# Patient Record
Sex: Female | Born: 1975 | Race: White | Hispanic: No | Marital: Married | Smoking: Never smoker
Health system: Southern US, Community
[De-identification: ages and names within clinical notes are randomized; demographics above are authoritative.]

## PROBLEM LIST (undated history)

## (undated) DIAGNOSIS — C801 Malignant (primary) neoplasm, unspecified: Secondary | ICD-10-CM

## (undated) DIAGNOSIS — L309 Dermatitis, unspecified: Secondary | ICD-10-CM

## (undated) DIAGNOSIS — I493 Ventricular premature depolarization: Secondary | ICD-10-CM

## (undated) DIAGNOSIS — E039 Hypothyroidism, unspecified: Secondary | ICD-10-CM

## (undated) DIAGNOSIS — I341 Nonrheumatic mitral (valve) prolapse: Secondary | ICD-10-CM

## (undated) DIAGNOSIS — O039 Complete or unspecified spontaneous abortion without complication: Secondary | ICD-10-CM

## (undated) DIAGNOSIS — K219 Gastro-esophageal reflux disease without esophagitis: Secondary | ICD-10-CM

## (undated) DIAGNOSIS — K589 Irritable bowel syndrome without diarrhea: Secondary | ICD-10-CM

## (undated) DIAGNOSIS — Z9852 Vasectomy status: Secondary | ICD-10-CM

## (undated) HISTORY — PX: WISDOM TOOTH EXTRACTION: SHX21

## (undated) HISTORY — PX: COLONOSCOPY: SHX174

## (undated) HISTORY — PX: PELVIC LAPAROSCOPY: SHX162

## (undated) HISTORY — DX: Malignant (primary) neoplasm, unspecified: C80.1

## (undated) HISTORY — DX: Nonrheumatic mitral (valve) prolapse: I34.1

## (undated) HISTORY — DX: Ventricular premature depolarization: I49.3

## (undated) HISTORY — DX: Hypothyroidism, unspecified: E03.9

## (undated) HISTORY — DX: Gastro-esophageal reflux disease without esophagitis: K21.9

## (undated) HISTORY — DX: Dermatitis, unspecified: L30.9

## (undated) HISTORY — DX: Complete or unspecified spontaneous abortion without complication: O03.9

## (undated) HISTORY — DX: Vasectomy status: Z98.52

## (undated) HISTORY — DX: Irritable bowel syndrome, unspecified: K58.9

---

## 2003-07-25 ENCOUNTER — Other Ambulatory Visit: Admission: RE | Admit: 2003-07-25 | Discharge: 2003-07-25 | Payer: Self-pay | Admitting: Gynecology

## 2004-07-30 ENCOUNTER — Other Ambulatory Visit: Admission: RE | Admit: 2004-07-30 | Discharge: 2004-07-30 | Payer: Self-pay | Admitting: Gynecology

## 2005-01-28 DIAGNOSIS — C801 Malignant (primary) neoplasm, unspecified: Secondary | ICD-10-CM

## 2005-01-28 DIAGNOSIS — E039 Hypothyroidism, unspecified: Secondary | ICD-10-CM

## 2005-01-28 HISTORY — DX: Malignant (primary) neoplasm, unspecified: C80.1

## 2005-01-28 HISTORY — DX: Hypothyroidism, unspecified: E03.9

## 2005-05-28 HISTORY — PX: TOTAL THYROIDECTOMY: SHX2547

## 2005-11-22 ENCOUNTER — Inpatient Hospital Stay (HOSPITAL_COMMUNITY): Admission: AD | Admit: 2005-11-22 | Discharge: 2005-11-25 | Payer: Self-pay | Admitting: Gynecology

## 2005-12-03 ENCOUNTER — Inpatient Hospital Stay (HOSPITAL_COMMUNITY): Admission: AD | Admit: 2005-12-03 | Discharge: 2005-12-05 | Payer: Self-pay | Admitting: Gynecology

## 2005-12-09 ENCOUNTER — Inpatient Hospital Stay (HOSPITAL_COMMUNITY): Admission: AD | Admit: 2005-12-09 | Discharge: 2005-12-09 | Payer: Self-pay | Admitting: Obstetrics and Gynecology

## 2005-12-31 ENCOUNTER — Other Ambulatory Visit: Admission: RE | Admit: 2005-12-31 | Discharge: 2005-12-31 | Payer: Self-pay | Admitting: Gynecology

## 2007-01-02 ENCOUNTER — Other Ambulatory Visit: Admission: RE | Admit: 2007-01-02 | Discharge: 2007-01-02 | Payer: Self-pay | Admitting: Gynecology

## 2007-11-24 IMAGING — US US PELVIS COMPLETE
1 series · 14 of 24 positions shown · non-contrast
Comparison: none

CLINICAL DATA: Postop fever, C-section 11/22/05. 
 TRANSABDOMINAL PELVIC ULTRASOUND:
TECHNIQUE: Transabdominal ultrasound examination of the pelvis was performed including evaluation of the uterus, ovaries, adnexal regions, and pelvic cul-de-sac.

[Series 1: us pelvis complete · 0.43mm/px · 14 of 24 slices shown]
[im 1/24]
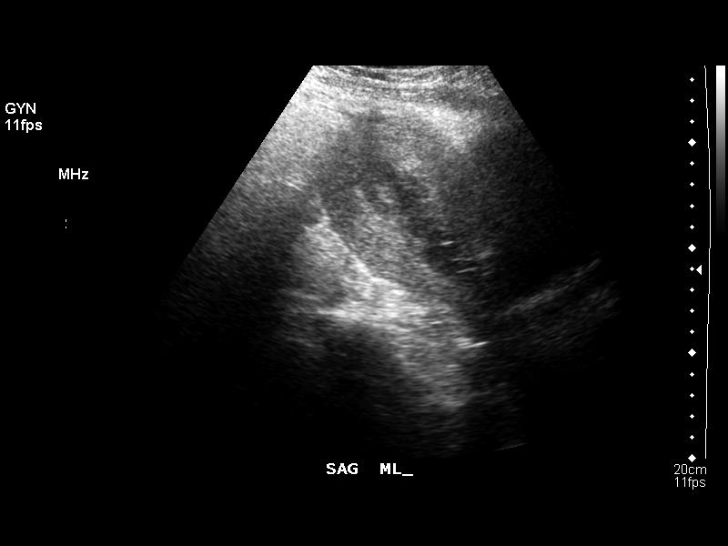
[im 3/24]
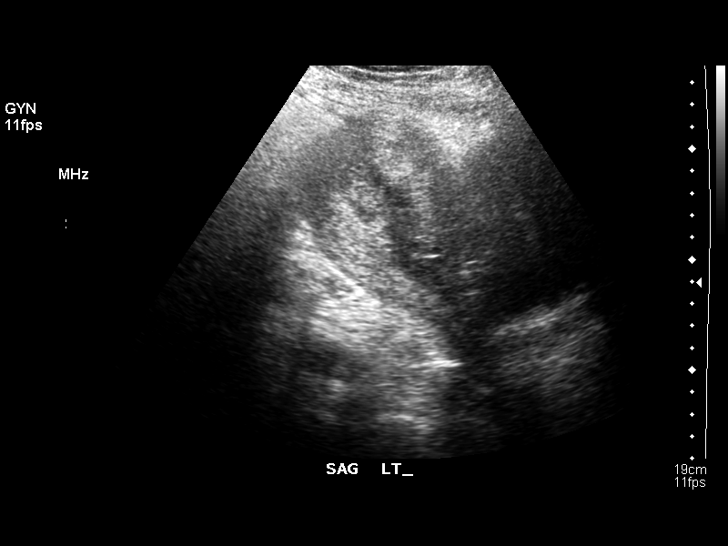
[im 5/24]
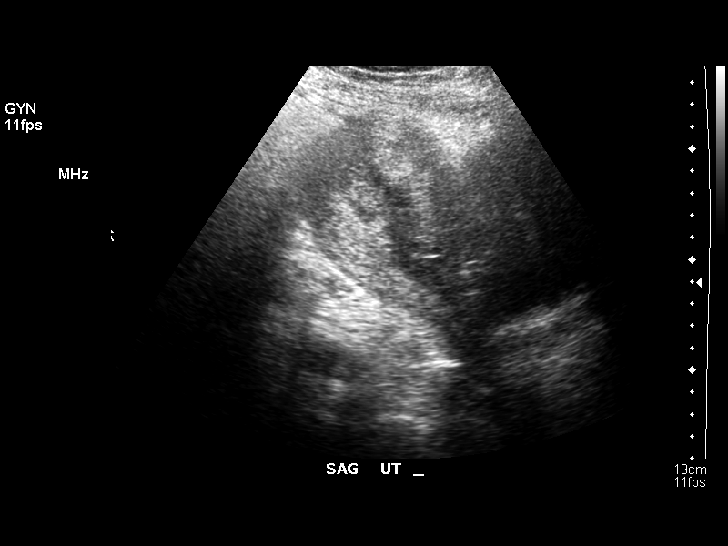
[im 7/24]
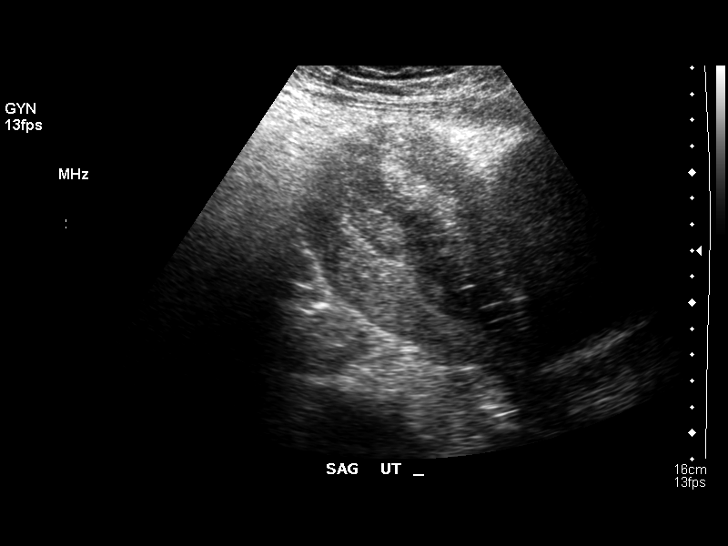
[im 8/24]
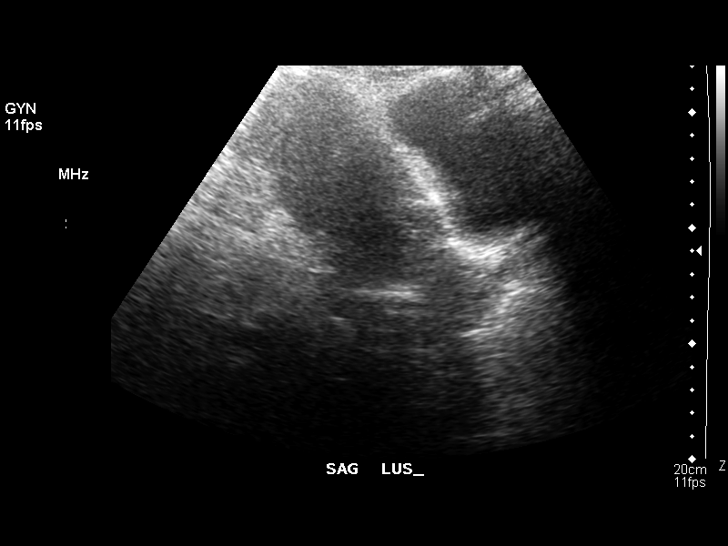
[im 10/24]
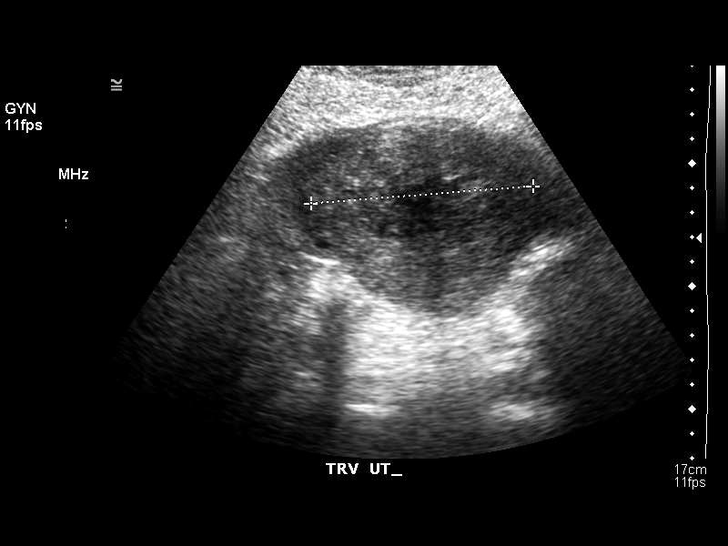
[im 12/24]
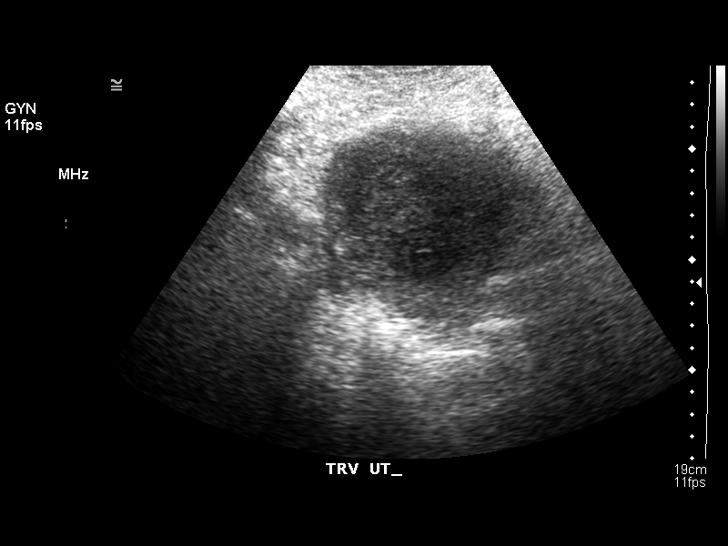
[im 13/24]
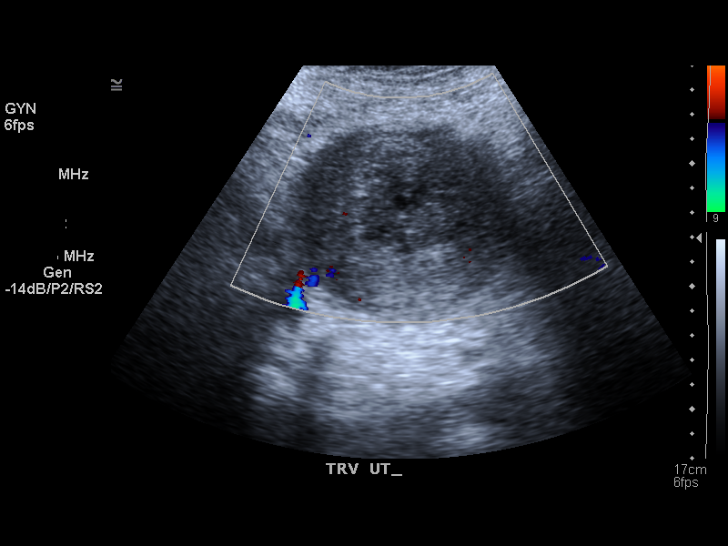
[im 15/24]
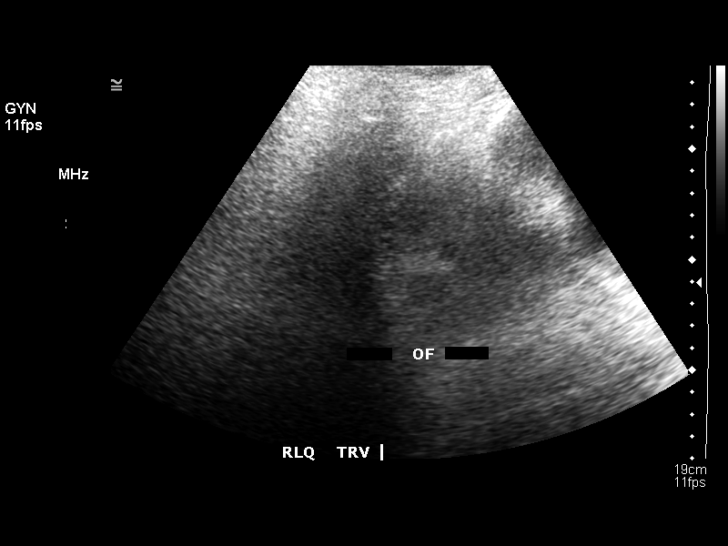
[im 17/24]
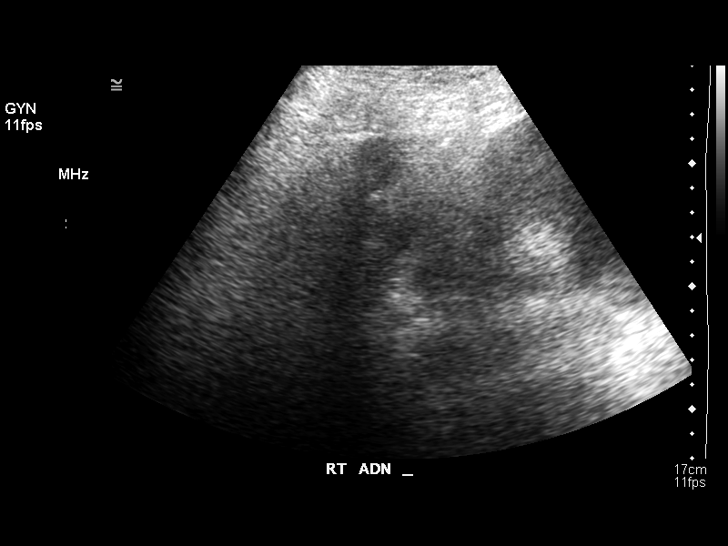
[im 19/24]
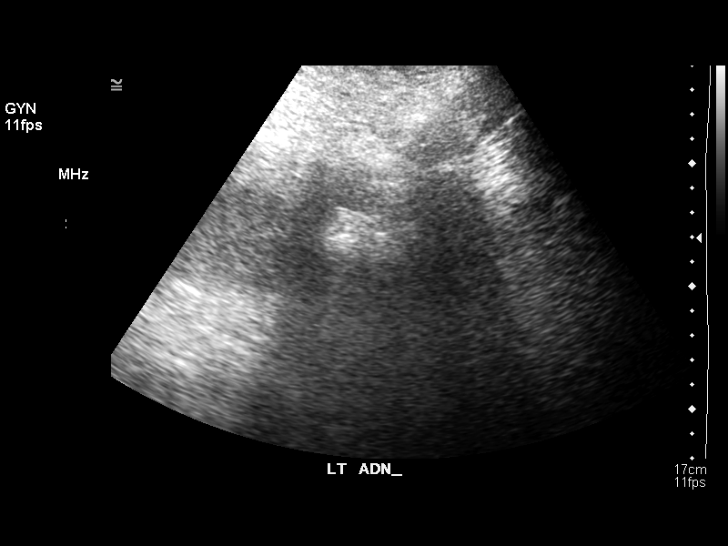
[im 20/24]
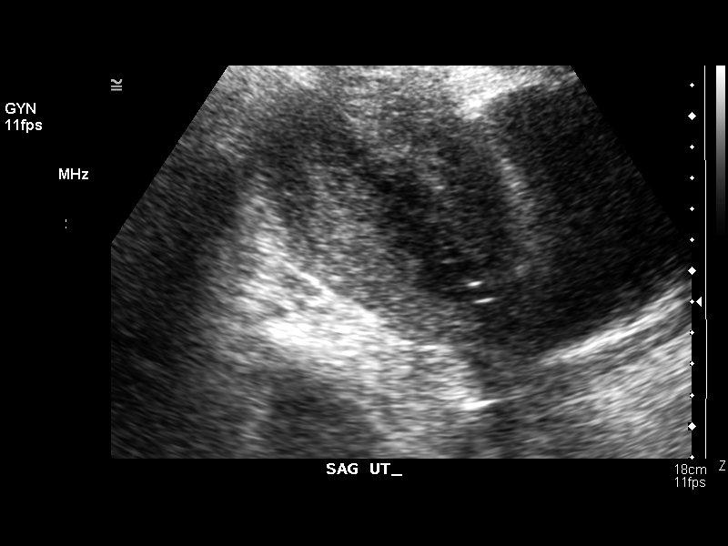
[im 22/24]
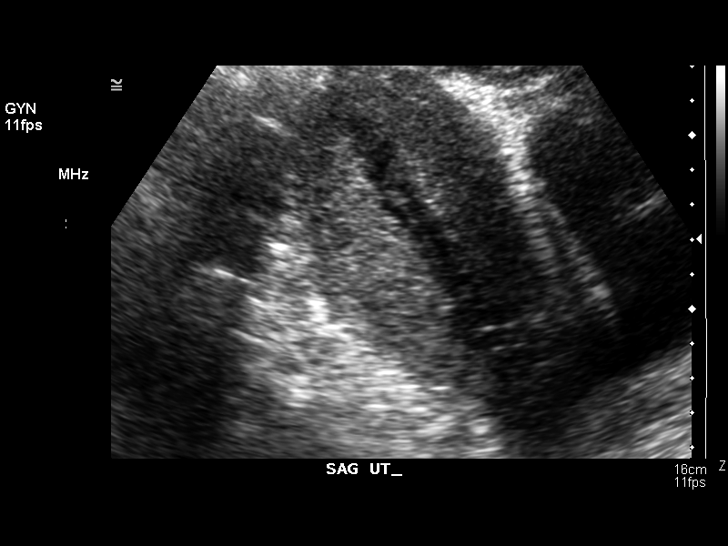
[im 24/24]
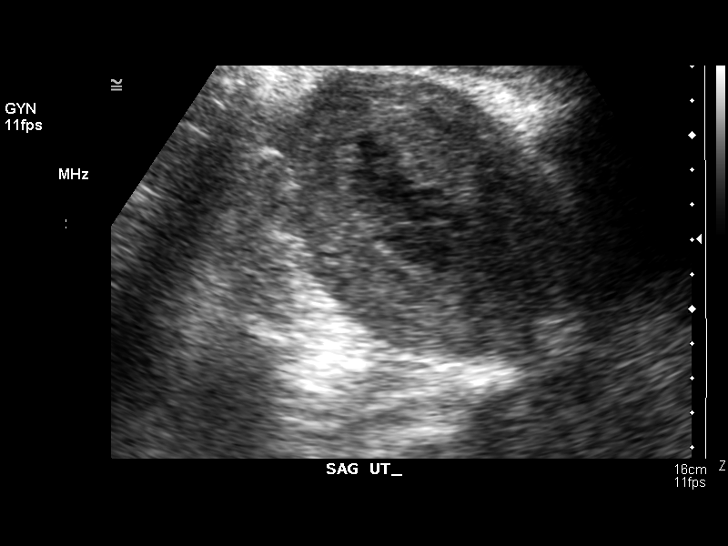

[14 of 24 positions shown; findings below may reference images not displayed]

FINDINGS: The uterus measures 11.3 cm sagittally with a depth of 6.9 cm and width of 9.1 cm.  There is low echogenicity within the endometrial cavity which is somewhat irregular.  Blood flow does not appear to be significantly increased, but the consideration would be that of residual blood, retained products, or possibly endometritis.  The endometrium measures 2.2 cm in diameter.  Neither ovary is visualized.  No fluid is seen within the pelvis.
IMPRESSION: Inhomogeneity within the endometrial cavity could represent blood, retained products, or possibly endometritis.  No abscess is seen and neither ovary is visible.

## 2008-01-13 ENCOUNTER — Encounter: Payer: Self-pay | Admitting: Gynecology

## 2008-01-13 ENCOUNTER — Ambulatory Visit: Payer: Self-pay | Admitting: Gynecology

## 2008-01-13 ENCOUNTER — Other Ambulatory Visit: Admission: RE | Admit: 2008-01-13 | Discharge: 2008-01-13 | Payer: Self-pay | Admitting: Gynecology

## 2009-05-24 ENCOUNTER — Inpatient Hospital Stay (HOSPITAL_COMMUNITY): Admission: RE | Admit: 2009-05-24 | Discharge: 2009-05-26 | Payer: Self-pay | Admitting: Obstetrics and Gynecology

## 2010-02-01 ENCOUNTER — Ambulatory Visit
Admission: RE | Admit: 2010-02-01 | Discharge: 2010-02-01 | Payer: Self-pay | Source: Home / Self Care | Attending: Gynecology | Admitting: Gynecology

## 2010-02-06 ENCOUNTER — Ambulatory Visit
Admission: RE | Admit: 2010-02-06 | Discharge: 2010-02-06 | Payer: Self-pay | Source: Home / Self Care | Attending: Gynecology | Admitting: Gynecology

## 2010-04-17 LAB — CBC
HCT: 24.7 % — ABNORMAL LOW (ref 36.0–46.0)
HCT: 35.1 % — ABNORMAL LOW (ref 36.0–46.0)
Hemoglobin: 12.3 g/dL (ref 12.0–15.0)
MCHC: 35.1 g/dL (ref 30.0–36.0)
MCV: 95.8 fL (ref 78.0–100.0)
RBC: 2.58 MIL/uL — ABNORMAL LOW (ref 3.87–5.11)
RDW: 13.1 % (ref 11.5–15.5)
WBC: 12.2 10*3/uL — ABNORMAL HIGH (ref 4.0–10.5)

## 2010-04-17 LAB — TYPE AND SCREEN: Antibody Screen: NEGATIVE

## 2010-06-15 NOTE — H&P (Signed)
[name], [name]             ACCOUNT NO.:  [id number]   MEDICAL RECORD NO.:  [id number]          PATIENT TYPE:  INP   LOCATION:  9169                          [facility]ACILITY:  [facility]   PHYSICIAN:  Timothy P. Fontaine, M.D.DATE OF BIRTH:  1976/01/21   DATE OF PROCEDURE:  DATE OF DISCHARGE:                      STAT - MUST CHANGE TO CORRECT WORK TYPE   CHIEF COMPLAINT:  Spontaneous rupture of membranes, contractions.   HISTORY OF PRESENT ILLNESS:  A 35 year old G3, P0, AB2 female at [redacted] weeks  gestation enters with gross rupture of membranes and irregular contractions.  The patient's prenatal course is complicated by total thyroidectomy May  2007. The patient is on thyroid replacement. Beta strep screen is negative.  Blood type is 0 negative. Rubella is positive. For the remainder of her  history, please see her [place].   PHYSICAL EXAMINATION:  HEENT:  Normal.  LUNGS:  Clear.  CARDIAC:  Regular rate. No rubs, murmurs or gallops.  ABDOMEN:  Gravid vertex fetus consistent with term. External monitors show  reactive fetal tracing with irregular contractions. Pelvic cervix fingertip  50% high vertex, positive rupture of membranes, clear fluid. IUPC placed.   ASSESSMENT AND PLAN:  A 35 year old G3, P0, AB 1 female with spontaneous  rupture of membranes clear, irregular contractions, cervix fingertip 50%  high. IUPC placed. Fetal tracing reactive. Will augment with Pitocin as  discussed with patient and husband who both understand and agree. Her beta  strep screen is negative. She is RH negative, rubella titer immune.      Timothy P. Fontaine, M.D.  Electronically Signed     TPF/MEDQ  D:  11/22/2005  T:  11/22/2005  Job:  [id number]

## 2010-06-15 NOTE — Op Note (Signed)
NAMEBERENIZE, GATLIN             ACCOUNT NO.:  [id number]   MEDICAL RECORD NO.:  [id number]          PATIENT TYPE:  INP   LOCATION:  9145                          [facility]ILITY[facility]   PHYSICIAN:  Juan H. Lily Peer, M.D.DATE OF BIRTH:  [birth year]   DATE OF PROCEDURE:  11/22/2005  DATE OF DISCHARGE:                                 OPERATIVE REPORT   INDICATIONS FOR OPERATION:  A 35 year old gravida 3, para 2 at 39-1/2 weeks  estimated gestational age is being taken to the operating room for a primary  cesarean section, secondary to prolonged rupture of membranes; and clinical  evidence of chorioamnionitis consisting of fetal tachycardia decreasing long-  term variability and maternal fever.  Also arrest of first stage of labor at  6 cm dilated.   PREOPERATIVE DIAGNOSES:  1. Term intrauterine pregnancy.  2. Prolonged rupture of membranes.  3. Chorioamnionitis.  4. Arrest of first stage of labor.   POSTOPERATIVE DIAGNOSES:  1. Term intrauterine pregnancy.  2. Prolonged rupture of membranes.  3. Chorioamnionitis.  4. Arrest of first stage of labor.   SURGEON:  Juan H. Lily Peer, M.D.   ANESTHESIA:  Epidural.   PROCEDURE PERFORMED:  Primary lower uterine segment transverse cesarean  section.   FINDINGS:  Viable female infant.  Apgars of 9 and 9 with a weight of 6  pounds 13 ounces.  Arterial cord pH of 7.36.  Clear amniotic fluid.  Normal  internal maternal pelvic anatomy.   DESCRIPTION OF OPERATION:  After the patient was adequately counseled she  was taken to the operating room where she was redosed through an through  epidural catheter.  Foley catheter was already in place.  Fetal heart rate  was in the 170s and scalp electrode was removed.  The abdomen was then  prepped and draped in the usual sterile fashion.  A Pfannenstiel skin  incision was made 2 cm above the symphysis pubis.  The incision was carried  out from the skin, subcutaneous tissue, and down to the rectus  fascia;  whereby a midline nick was made.  The fascia was incised in transverse  fashion.  The midline raphe was entered cautiously.  The bladder flap was  established; and the lower uterine segment was incised in a transverse  fashion.  Clear amniotic fluid was present.  The newborn's head was  delivered.  The nasopharyngeal area was bulb suctioned; and the cord was  doubly clamped and excised.  The newborn gave an immediate cry, was shown to  the parents; then passed off to the neonatologist who was in attendance who  gave the above-mentioned parameters.   After cord blood was obtained.  The placenta was delivered from the  intrauterine cavity and submitted for pathological evaluation.  The Pitocin  drip was initiated.  The patient had received 2.5 milliunits of Pen-G 2  hours prior and she will continue it postop for 24 hours.  The uterus was  exteriorized.  The intrauterine cavity was swept clear of the remaining  products of conception; and Pitocin drip had been started as a uterotonic  agent.  A transverse incision  was closed with a two-layer fashion.  The  first was a locking stitch of #0 Vicryl suture.  The second layer an  imbricating manner with #0 Vicryl suture as well.  The uterus was placed  back in the pelvic cavity.  Once again, each layer was copiously irrigated  with normal saline solution.  Sponge count and needle count were correct.  The transverse incision with evidence of good hemostasis.  The visceral  peritoneum was now reapproximated, but the rectus fascia was closed with  running stitch of #0 Vicryl suture.  The subcutaneous bleeders were Bovie  cauterized; and the skin was reapproximated with skin clips followed by  Xeroform gauze and 4 x 4 dressing.  The patient was transferred to recovery  room with stable vital signs.  Blood loss from procedure was 500 mL, IV  fluids 650 mL of lactated Ringer's.  Urine output 375 mL.      Juan H. Lily Peer, M.D.   Electronically Signed     JHF/MEDQ  D:  11/22/2005  T:  11/24/2005  Job:  [id number]

## 2010-06-15 NOTE — Discharge Summary (Signed)
[name], [name]             ACCOUNT NO.:  [id number]   MEDICAL RECORD NO.:  [id number]          PATIENT TYPE:  INP   LOCATION:  9145                          [facility]   PHYSICIAN:  Juan H. Lily Peer, M.D.DATE OF BIRTH:  05-23-1975   DATE OF ADMISSION:  11/22/2005  DATE OF DISCHARGE:  11/25/2005                                 DISCHARGE SUMMARY   TOTAL DAYS HOSPITALIZED:  Three.   HISTORY:  The patient is a 35 year old gravida 3, para 0, AB 2, now para 1  at 3 weeks' gestation when she was admitted to the hospital complaining of  spontaneous rupture of membranes.  The patient's prenatal course had been  significant for the fact that she had a total thyroidectomy and had been on  supplemental Synthroid.  The patient was augmented on Pitocin and had an  epidural but had prolonged rupture of membranes, developed chorioamnionitis  whereby her temperature was starting to increase, the highest was 100.8, and  she was taken for a primary cesarean secondary to arrest of first stage of  labor, chorioamnionitis, and prolonged rupture of membranes.  She delivered  a viable female infant, Apgars of 9 and 9, with a weight of 6 pounds 13  ounces.  Arterial cord pH was 7.36.  There was clear amniotic fluid.  Normal  maternal pelvic anatomy was reported.  She had a blood loss of 500 ml.  Postoperatively, the patient's hemoglobin and hematocrit were 9.1 and 25.1  respectively with a platelet count of 169,000.  Since her blood type was O  negative, the newborn was tested and was Rh positive and the mother received  RhoGAM on her second postoperative day.  The patient was restarted back on  her Synthroid where she was taking 125 mcg daily.  She was up and ambulating  and tolerating regular diet well and was ready to be discharged home on her  third postoperative day.   FINAL DIAGNOSES:  1. Term intrauterine pregnancy.  2. 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  3. Prolonged  rupture of membranes.  4. Chorioamnionitis.  5. Arrest of first stage of labor.  6. Postpartum anemia.  7. RhoGAM candidate.   PROCEDURES PERFORMED:  1. Fetal monitoring.  2. Intravenous antibiotic.  3. Primary lower uterine segment transverse cesarean section.  4. RhoGAM administration.   FINAL DISPOSITION AND FOLLOWUP:  The patient was discharged home on her  third postoperative day.  She was to return to the office in 2-3 days to  have her staples removed.  She was given a prescription for Darvocet to take  1 per oral every 4-6 hours as needed for pain and interchange with Motrin  800 three times daily as needed.  She  was instructed to continue her prenatal vitamins 1 per oral daily along with  her iron supplementation.  Discharge instructions were provided and we will  follow closely.   PLAN:  As per assessment above.      Juan H. Lily Peer, M.D.  Electronically Signed     JHF/MEDQ  D:  12/21/2005  T:  12/21/2005  Job:  [id number]

## 2010-06-15 NOTE — Discharge Summary (Signed)
[name], [name]             ACCOUNT NO.:  [id number]   MEDICAL RECORD NO.:  [id number]          PATIENT TYPE:  INP   LOCATION:  9303                          [facility]   PHYSICIAN:  Juan H. Lily Peer, M.D.DATE OF BIRTH:  [birth year]   DATE OF ADMISSION:  12/03/2005  DATE OF DISCHARGE:  12/05/2005                                 DISCHARGE SUMMARY   HISTORY:  The patient is a 35 year old gravida 3, para 1 who was status post  cesarean section October 26, had presented complaining of increasing lower  abdominal discomfort and dull and aching in the lower pelvis moving to her  right side and midline.  She states she had been voiding without any  problem.  She had noted some increased bleeding over the past 24 hours and  had passed some small clots.  She was admitted with a working diagnosis of  either retained products of conception or endometritis and to rule out an  abscess.  On admission, her white blood count was 11.7,  hemoglobin/hematocrit of 10.0 and 29.1.  Her electrolytes were normal.  She  had been started on Unasyn 3 grams IV q. 6 hours x2 followed by 1.5 grams q.  6 hours and given pain medication and monitored.  She did have an ultrasound  to rule out the possibility of an abscess and her ultrasound was described  as essentially normal with exception of just inhomogeneity with an  endometrial cavity representing retained products or possible endometritis  but no abscess was seen and neither ovary was visible.  The patient  continued to remain afebrile and was switched to oral antibiotics and  discharged home on her second post-hospitalization date.  Her urine culture  had mixed bacterial flora only.   FINAL DIAGNOSIS:  Postpartum endometritis.   PROCEDURES PERFORMED:  1. Intravenous antibiotics.  2. Ultrasound.   FINAL DISPOSITION AND FOLLOWUP:  The patient was discharged home on her  second hospitalization day.  She was afebrile, was up and ambulating, and  voiding well.  Her pain had decreased significantly.  She was discharged  home on oral antibiotics consisting of Keflex 500 milligrams by mouth twice  daily for a 10-day course and she was to follow up in the office for a  routine postpartum visit or as needed.      Juan H. Lily Peer, M.D.  Electronically Signed     JHF/MEDQ  D:  12/21/2005  T:  12/21/2005  Job:  [id number]

## 2010-06-15 NOTE — H&P (Signed)
[name], [name]             ACCOUNT NO.:  [id number]   MEDICAL RECORD NO.:  [id number]          PATIENT TYPE:  INP   LOCATION:  9371                          [facility]   PHYSICIAN:  Timothy P. Fontaine, M.D.DATE OF BIRTH:  [birth year]   DATE OF ADMISSION:  12/03/2005  DATE OF DISCHARGE:                                HISTORY & PHYSICAL   CHIEF COMPLAINT:  Abdominal pain.   HISTORY OF PRESENT ILLNESS:  A 35 year old, G66, P31 female status post  cesarean section November 22, 2005, who presents complaining over the last  day or so of increasing lower abdominal discomfort.  The patient noticed  that she is having a lot of cramping, wave-like pain with an underlying dull  ache in the lower pelvis which moves from the right side over to the midline  in nature. She is voiding frequently, causing discomfort also with emptying  of her bladder.  She has had increased bleeding over the last 24 hours and  has passed several smaller clots and one palm-size clot.  Her last bowel  movement was yesterday.  She is eating and drinking.  No real nausea or  vomiting or diarrhea.  She has no cough or sputum.  She is pumping her  breasts any feeding to her baby in the bottle and notes no persistent breast  discomfort.  No lower leg significant swelling or tenderness.   PAST MEDICAL HISTORY:  Significant for thyroidectomy for thyroid carcinoma  this pregnancy, currently on replacement Synthroid 125 mcg daily.   ALLERGIES:  CODEINE.   REVIEW OF SYSTEMS:  Otherwise noncontributory.   SOCIAL HISTORY:  Noncontributory.   FAMILY HISTORY:  Noncontributory.   PHYSICAL EXAMINATION:  VITAL SIGNS: Temperature 100.2, blood pressure  122/78, pulse approximately 80, respirations normal.  HEENT:  Grossly normal.  LUNGS: Clear.  CARDIAC:  Regular rate.  No rubs, murmurs, or gallops.  ABDOMEN:  Active bowel sounds.  Mild diffuse tenderness to deep palpation.  Uterus not readily palpable suprapubically,  although tenderness on deep  palpation suprapubically.  Incision with Steri-Strips intact healing nicely.  No evidence of erythema, underlying induration, fluid collection.  PELVIC:  Not performed.  EXTREMITIES: Without deformity, significant swelling.   ASSESSMENT AND PLAN:  A 35 year old gravida 3, para 1 female status post  cesarean section November 22, 2005, with several days of increasing lower  abdominal pain, wave-like with underlying aching.  Physical exam is grossly  unremarkable.  I did not perform a bimanual, but patient is mildly tender to  abdominal palpation. Most likely diagnosis is postoperative endomyometritis,  possible urinary infection, doubt stone, possible non-gynecologic such as  appendicitis, although eating without nausea.  Vomited yesterday.   Will initiate evaluation to include CBC, urine culture. UA in the office was  relatively unremarkable with 1-3 wbc's, occasional rbc's, few epithelials,  trace bacteruria.  Will go ahead and check baseline culture, ultrasound,  rule out abscess or significant pelvic collection.  Begin IV antibiotics  with Unasyn 3 g initial, the 1.5 q. 6 h., and follow clinical course.  If  not a rapid improvement, then will institute a  more involved evaluation,  possible CT to rule out septic pelvic thrombophlebitis, assess appendix.  Game plan was discussed with the patient and her husband, and they  understand and agree.      Timothy P. Fontaine, M.D.  Electronically Signed     TPF/MEDQ  D:  12/03/2005  T:  12/03/2005  Job:  [id number]

## 2010-08-21 ENCOUNTER — Encounter: Payer: Self-pay | Admitting: Gynecology

## 2010-08-23 ENCOUNTER — Encounter: Payer: Self-pay | Admitting: Anesthesiology

## 2010-09-03 ENCOUNTER — Encounter: Payer: Self-pay | Admitting: Gynecology

## 2010-09-06 ENCOUNTER — Encounter: Payer: Self-pay | Admitting: Gynecology

## 2010-09-18 ENCOUNTER — Ambulatory Visit (INDEPENDENT_AMBULATORY_CARE_PROVIDER_SITE_OTHER): Payer: PRIVATE HEALTH INSURANCE | Admitting: Gynecology

## 2010-09-18 ENCOUNTER — Other Ambulatory Visit (HOSPITAL_COMMUNITY)
Admission: RE | Admit: 2010-09-18 | Discharge: 2010-09-18 | Disposition: A | Discharge status: Home / Self Care | Payer: PRIVATE HEALTH INSURANCE | Source: Ambulatory Visit | Attending: Gynecology | Admitting: Gynecology

## 2010-09-18 ENCOUNTER — Encounter: Payer: Self-pay | Admitting: Gynecology

## 2010-09-18 DIAGNOSIS — N920 Excessive and frequent menstruation with regular cycle: Secondary | ICD-10-CM

## 2010-09-18 DIAGNOSIS — Z01419 Encounter for gynecological examination (general) (routine) without abnormal findings: Secondary | ICD-10-CM | POA: Insufficient documentation

## 2010-09-18 DIAGNOSIS — M2550 Pain in unspecified joint: Secondary | ICD-10-CM

## 2010-09-18 DIAGNOSIS — R109 Unspecified abdominal pain: Secondary | ICD-10-CM

## 2010-09-18 DIAGNOSIS — L989 Disorder of the skin and subcutaneous tissue, unspecified: Secondary | ICD-10-CM | POA: Insufficient documentation

## 2010-09-18 DIAGNOSIS — Z13220 Encounter for screening for lipoid disorders: Secondary | ICD-10-CM

## 2010-09-18 DIAGNOSIS — C73 Malignant neoplasm of thyroid gland: Secondary | ICD-10-CM

## 2010-09-18 DIAGNOSIS — K219 Gastro-esophageal reflux disease without esophagitis: Secondary | ICD-10-CM | POA: Insufficient documentation

## 2010-09-18 DIAGNOSIS — L568 Other specified acute skin changes due to ultraviolet radiation: Secondary | ICD-10-CM

## 2010-09-18 DIAGNOSIS — I493 Ventricular premature depolarization: Secondary | ICD-10-CM

## 2010-09-18 DIAGNOSIS — I4949 Other premature depolarization: Secondary | ICD-10-CM

## 2010-09-18 DIAGNOSIS — N946 Dysmenorrhea, unspecified: Secondary | ICD-10-CM | POA: Insufficient documentation

## 2010-09-18 DIAGNOSIS — N809 Endometriosis, unspecified: Secondary | ICD-10-CM | POA: Insufficient documentation

## 2010-09-18 MED ORDER — NORETHINDRONE ACET-ETHINYL EST 1.5-30 MG-MCG PO TABS: 1.0000 | ORAL_TABLET | Freq: Every day | ORAL | Status: DC

## 2010-09-18 NOTE — Patient Instructions (Signed)
Patient to take one oral contraceptive pill daily for 21 days discard the last 7 tablets start a second pack take 21 tablets discard the last 7 tablets start the third pack intake all 28 tablets thereby she will have a menstrual cycle every 3 months. [name] remember to get your mammogram and to follow with Rheumatologist. Will call you if any of the labs are abnormal. Please check your breast once a month.

## 2010-09-18 NOTE — Progress Notes (Signed)
[name] May 12, 1975 [id number]   History:    35 y.o.  for annual exam has been followed by the Department of endocrinology at [facility] in [facility] for papillary thyroid carcinoma, follicular variant, bilateral, status post total thyroidectomy and May 2007, status post radioactive iodine for ablation of the thyroid, with subsequent hypothyroidism for which patient is on Synthroid 175 mcg daily. Patient with several complaints today one is that she suffers from dysmenorrhea and menorrhagia and her cycles last 7-10 days. Last year she was prescribed Lysteda the patient was afraid to take medication. Patient has a history of endometriosis in the past she also feels bloated before her periods and occasional dyspareunia. Her husband has had a vasectomy. She is also been complaining over the past year on and off and worse the past 4 months of joint pains and pressure the arms and some photosensitivity as well. She is also been followed by the cardiologist at [facility] and was been monitoring her PVCs for which she is currently on metoprolol XL are 12.5 mg daily. Patient in frequently does result breast examinations and is due for mammogram.  Past medical history,surgical history, family history and social history were all reviewed and documented in the EPIC chart. ROS:  Was performed and pertinent positives and negatives are included in the history.  Exam: chaperone present Filed Vitals:   09/18/10 1505  BP: 128/86   @WEIGHT @ Body mass index is 30.59 kg/(m^2).  General appearance : Well developed well nourished female. Skin grossly normal HEENT: Neck supple, trachea midline Lungs: Clear to auscultation, no rhonchi or wheezes Heart: Regular rate and rhythm, no murmurs or gallops Breast:Examined in sitting and supine position were symmetrical in appearance, no palpable masses, to skin retraction, no nipple inversion, no nipple discharge and no axillary or  supraclavicular lymphadenopathy Abdomen: no palpable masses or tenderness Pelvic  Ext/BUS/vagina  normal   Cervix  normal   Uterus  anteverted, normal size, shape and contour, midline and mobile nontender   Adnexa  Without masses or tenderness  Anus and perineum  normal   Rectovaginal  normal sphincter tone without palpated masses or tenderness             Hemoccult not done     Assessment/Plan:  35 y.o. female for annual exam with dysmenorrhea menorrhagia bloating dyspareunia and past history of endometriosis would benefit from oral contraceptive pill. Risks benefits and pros and cons of oral contraceptive pills were discussed she is a nonsmoker and has no family history of any bleeding disorders or clotting disorders. She will be placed on a low dose oral contraceptive pill such as Janel 1/20 to take continuously and withdrawal every 3 months thereby she will have before periods year instead of 12 to help with her endometriotic symptoms. As to her arthralgias and photosensitivity we will check an antinuclear antibody as well as rheumatoid factor blood studies as well as a contrast metabolic panel CBC cholesterol urinalysis along with her Pap smear today. I would recommend she follow up with a rheumatologist at [facility] for further evaluation and will have these lab results available per their request. Patient was instructed to take her calcium and vitamin D for osteoporosis as well and to do her monthly self breast examinations. She will continue to follow up with a cardiologist and endocrinologist at [facility] as well.    Kristine Edwards MD, 4:39 PM 09/18/2010

## 2010-09-19 ENCOUNTER — Telehealth: Payer: Self-pay | Admitting: *Deleted

## 2010-09-19 LAB — COMPREHENSIVE METABOLIC PANEL
CO2: 26 mEq/L (ref 19–32)
Calcium: 9.4 mg/dL (ref 8.4–10.5)
Chloride: 104 mEq/L (ref 96–112)
Glucose, Bld: 105 mg/dL — ABNORMAL HIGH (ref 70–99)
Sodium: 139 mEq/L (ref 135–145)
Total Bilirubin: 0.4 mg/dL (ref 0.3–1.2)
Total Protein: 7.2 g/dL (ref 6.0–8.3)

## 2010-09-19 LAB — ANA: Anti Nuclear Antibody(ANA): NEGATIVE

## 2010-09-19 LAB — RHEUMATOID FACTOR: Rhuematoid fact SerPl-aCnc: <10 IU/mL (ref <=14)

## 2010-09-19 NOTE — Telephone Encounter (Signed)
PT CALLED STATING PHARMACY NEVER GOT NEW RX? LM FOR PT TO CALL.

## 2010-09-20 ENCOUNTER — Telehealth: Payer: Self-pay | Admitting: *Deleted

## 2010-09-20 NOTE — Telephone Encounter (Signed)
THE BELOW NOTE HAS BEEN DONE IN TELEPHONE CALL.

## 2010-09-20 NOTE — Telephone Encounter (Signed)
SPOKE WITH PATIENT RE:THE BELOW NOTE. AND SHE WANTS KNOW IF FASTING GLUCOSE & FASTING LIVER FUNCTION TEST CAN BE DRAWN AT THE CLINIC AT HER JOB? PT ALSO WANTED ME TO LET YOU KNOW SHE HAS HISTORY OF ESPTEIN-BARR VIRUS. PLEASE ADVISE

## 2010-09-20 NOTE — Telephone Encounter (Signed)
SPOKE WITH PT AND TOLD HER TO WRITE THE FOLLOWING LABS TO BE DRAWN PER JF REQUEST. ONCE RESULTS IN PT WILL HAVE THEM FAXED TO OFFICE. PT RX BCP ALSO CALLED IN TO [facility] PER HER REQUEST. RX SENT TO WRONG PHARMACY ON OFFICE VISIT.

## 2010-09-20 NOTE — Telephone Encounter (Signed)
[name], this is a correction tell patient that she will need a fasting SGOT and SGPT along with fasting blood sugar and I would like to check a hepatitis C as well. Had been sent me the results via fax so I can review. If these tests come out normal she may start the oral contraceptive pill as previously recommended.

## 2010-09-20 NOTE — Telephone Encounter (Signed)
Message copied by Aura Camps on Thu Sep 20, 2010 11:32 AM ------      Message from: Ok Edwards      Created: Wed Sep 19, 2010  5:30 PM       Victorino Dike, please call Jazsmine and tell her to hold off on starting the oral contraceptive pill and she'll she has the following lab tests repeated. She needs to return to the office in a fasting state to check her fasting blood sugar since the random blood sugar today was elevated at 105. Also on the comprehensive metabolic panel one of her liver function test was slightly elevated at 65. When she returns to have a fasting blood sugar will check a fasting liver function tests as well (SGOT, SGPT). Tell her that the study for rheumatoid arthritis and was negative as was the test for lupus. If upon repeat her liver function tests are normal she can instructed oral contraceptive pill on the second day of her cycle as we discussed in the office today.

## 2010-09-20 NOTE — Telephone Encounter (Signed)
[name], please tell patient she can have her blood drawn at her clinic in a fasting state that also I would like to add A. hepatitis C as well. She will need a fasting lipid profile and a fasting comprehensive metabolic panel. Make sure that they fax me the results psych and review and recommend accordingly.

## 2010-09-21 ENCOUNTER — Telehealth: Payer: Self-pay | Admitting: *Deleted

## 2010-09-21 NOTE — Telephone Encounter (Signed)
LEFT DETAILED MESSAGE ON PT VM THAT HER LABS WERE OKAY. TO TAKE BCP AS DIRECTED BY DR.JF

## 2010-09-28 ENCOUNTER — Encounter: Payer: Self-pay | Admitting: Gynecology

## 2010-12-04 ENCOUNTER — Telehealth: Payer: Self-pay | Admitting: *Deleted

## 2010-12-04 NOTE — Telephone Encounter (Signed)
(  Last office visit 09/18/10) Pt is taking Loestrin birth control to help with regulate her bleeding she is to take 1st pack take 21 pills and then discard the last 7 pills, then start  2nd pack take 21 pills then discard last 7 pills 3rd pack take all 28 pill to have a period every 3 months. Pt missed 1 pill on 1st pack bled for 3 days then 2nd pack missed 1 pill and spotted for 1 day, she is now completed her 3rd 28 pill  pack and no period yet. Pt wants to know if she should start taking her new pack? Please advise.

## 2010-12-04 NOTE — Telephone Encounter (Signed)
Lm detailed message on pt vm with the below note.

## 2010-12-04 NOTE — Telephone Encounter (Signed)
(  Last office visit 09/18/10) Pt is taking Loestrin birth control to help with regulate her bleeding she is to take 1st pack take 21 pills and then discard the last 7 pills, then start 2nd pack take 21 pills then discard last 7 pills 3rd pack take all 28 pill to have a period every 3 months. Pt missed 1 pill on 1st pack bled for 3 days then 2nd pack missed 1 pill and spotted for 1 day, she is now completed her 3rd 28 pill pack and no period yet. Pt wants to know if she should start taking her new pack? Please advise   I would recommend patient take a home pregnancy test first to make sure she's not pregnant if not that she can start her next pack of pill and is important that she is here for compliance is not she's going to deal would later with dysfunction uterine bleeding.

## 2011-04-05 ENCOUNTER — Other Ambulatory Visit: Payer: Self-pay | Admitting: *Deleted

## 2011-04-05 DIAGNOSIS — N920 Excessive and frequent menstruation with regular cycle: Secondary | ICD-10-CM

## 2011-04-05 DIAGNOSIS — N946 Dysmenorrhea, unspecified: Secondary | ICD-10-CM

## 2011-04-05 MED ORDER — NORETHINDRONE ACET-ETHINYL EST 1.5-30 MG-MCG PO TABS: 1.0000 | ORAL_TABLET | Freq: Every day | ORAL | Status: DC

## 2011-04-05 NOTE — Telephone Encounter (Signed)
Pharmacy called requesting refill on birth control, rx sent to pharmacy.

## 2011-09-19 ENCOUNTER — Ambulatory Visit (INDEPENDENT_AMBULATORY_CARE_PROVIDER_SITE_OTHER): Payer: PRIVATE HEALTH INSURANCE | Admitting: Gynecology

## 2011-09-19 ENCOUNTER — Encounter: Payer: Self-pay | Admitting: Gynecology

## 2011-09-19 VITALS — BP 126/80 | Ht 65.0 in | Wt 181.0 lb

## 2011-09-19 DIAGNOSIS — N946 Dysmenorrhea, unspecified: Secondary | ICD-10-CM

## 2011-09-19 DIAGNOSIS — R635 Abnormal weight gain: Secondary | ICD-10-CM

## 2011-09-19 DIAGNOSIS — N920 Excessive and frequent menstruation with regular cycle: Secondary | ICD-10-CM

## 2011-09-19 DIAGNOSIS — Z01419 Encounter for gynecological examination (general) (routine) without abnormal findings: Secondary | ICD-10-CM

## 2011-09-19 MED ORDER — NORETHINDRONE ACET-ETHINYL EST 1.5-30 MG-MCG PO TABS: 1.0000 | ORAL_TABLET | Freq: Every day | ORAL | Status: DC

## 2011-09-19 NOTE — Progress Notes (Signed)
[name] 06/30/1975 [id number]   History:   36 y.o. for annual exam has been followed by the Department of endocrinology at [facility] in [facility] for papillary thyroid carcinoma, follicular variant, bilateral, status post total thyroidectomy and May 2007, status post radioactive iodine for ablation of the thyroid, with subsequent hypothyroidism for which patient is on Synthroid 200 mcg daily.She suffers from dysmenorrhea and menorrhagia and has done well on the continuous oral contraceptive pill.. Last year she was prescribed Lysteda the patient was afraid to take medication. Patient has a history of endometriosis in the past. Her husband has had a vasectomy. The doctors at [facility] recently did her thyroid function tests were recently adjusted her Synthroid. She also has been followed by Dr. Newt Lukes her cardiologist for her PVCs. See medication list. She also recently had a colonoscopy and was diagnosed with IBS. And some ileitis. She was taking to NSAIDs for dysmenorrhea and menorrhagia. She is taking calcium and vitamin D for osteoporosis prevention. She did have a baseline mammogram in one on the women's age 71.  Past medical history,surgical history, family history and social history were all reviewed and documented in the EPIC chart.  Gynecologic History Patient's last menstrual period was 09/14/2011. Contraception: OCP (estrogen/progesterone) and vasectomy Last Pap: 2012. Results were: normal Last mammogram: 2012. Results were: normal  Obstetric History OB History    Grav Para Term Preterm Abortions TAB SAB Ect Mult Living   4 2   2  2   2      # Outc Date GA Lbr Len/2nd Wgt Sex Del Anes PTL Lv   1 PAR            2 PAR            3 SAB            4 SAB                ROS: A ROS was performed and pertinent positives and negatives are included in the history.  GENERAL: No fevers or chills. HEENT: No change in vision, no earache, sore throat or  sinus congestion. NECK: No pain or stiffness. CARDIOVASCULAR: No chest pain or pressure. No palpitations. PULMONARY: No shortness of breath, cough or wheeze. GASTROINTESTINAL: No abdominal pain, nausea, vomiting or diarrhea, melena or bright red blood per rectum. GENITOURINARY: No urinary frequency, urgency, hesitancy or dysuria. MUSCULOSKELETAL: No joint or muscle pain, no back pain, no recent trauma. DERMATOLOGIC: No rash, no itching, no lesions. ENDOCRINE: No polyuria, polydipsia, no heat or cold intolerance. No recent change in weight. HEMATOLOGICAL: No anemia or easy bruising or bleeding. NEUROLOGIC: No headache, seizures, numbness, tingling or weakness. PSYCHIATRIC: No depression, no loss of interest in normal activity or change in sleep pattern.     Exam: chaperone present  BP 126/80  Ht 5\' 5"  (1.651 m)  Wt 181 lb (82.101 kg)  BMI 30.12 kg/m2  LMP 09/14/2011  Body mass index is 30.12 kg/(m^2).  General appearance : Well developed well nourished female. No acute distress HEENT: Neck supple, trachea midline, no carotid bruits, no thyroidmegaly Lungs: Clear to auscultation, no rhonchi or wheezes, or rib retractions  Heart: Regular rate and rhythm, no murmurs or gallops Breast:Examined in sitting and supine position were symmetrical in appearance, no palpable masses or tenderness,  no skin retraction, no nipple inversion, no nipple discharge, no skin discoloration, no axillary or supraclavicular lymphadenopathy Abdomen: no palpable masses or tenderness, no rebound or guarding Extremities:  no edema or skin discoloration or tenderness  Pelvic:  Bartholin, Urethra, Skene Glands: Within normal limits             Vagina: No gross lesions or discharge  Cervix: No gross lesions or discharge  Uterus  anteverted, normal size, shape and consistency, non-tender and mobile  Adnexa  Without masses or tenderness  Anus and perineum  normal   Rectovaginal  normal sphincter tone without palpated  masses or tenderness             Hemoccult not done     Assessment/Plan:  35 y.o. female for annual exam doing well. Will obtain a cholesterol, CBC, hemoglobin A1c. We'll also check a urinalysis. We discussed the new Pap smear screening guidelines and she will not need a Pap smear for 2 more years. After dysmenorrhea and menorrhagia when it does occur she will use women's Tylenol menstrual tablets that she can take 1-2 tablets every 6 hours when necessary. She was instructed to take her calcium and vitamin D for osteoporosis prevention. We discussed importance of regular exercise. She states her vaccinations are up-to-date. She was instructed to continue her monthly self breast examination her next mammogram is age 39. She is going to try to obtain a copy of her cardiology consult as well as with the endocrinologist at [facility] of the week and update our records here on epic.    Ok Edwards MD, 3:27 PM 09/19/2011

## 2011-09-19 NOTE — Patient Instructions (Addendum)

## 2011-09-20 ENCOUNTER — Telehealth: Payer: Self-pay | Admitting: *Deleted

## 2011-09-20 ENCOUNTER — Encounter: Payer: Self-pay | Admitting: Gynecology

## 2011-09-20 DIAGNOSIS — N920 Excessive and frequent menstruation with regular cycle: Secondary | ICD-10-CM

## 2011-09-20 DIAGNOSIS — N946 Dysmenorrhea, unspecified: Secondary | ICD-10-CM

## 2011-09-20 LAB — CBC WITH DIFFERENTIAL/PLATELET
Basophils Absolute: 0 10*3/uL (ref 0.0–0.1)
Basophils Relative: 0 % (ref 0–1)
Eosinophils Absolute: 0.3 10*3/uL (ref 0.0–0.7)
Eosinophils Relative: 4 % (ref 0–5)
HCT: 40.9 % (ref 36.0–46.0)
MCH: 30.9 pg (ref 26.0–34.0)
MCHC: 34.5 g/dL (ref 30.0–36.0)
MCV: 89.7 fL (ref 78.0–100.0)
Monocytes Absolute: 0.5 10*3/uL (ref 0.1–1.0)
Monocytes Relative: 7 % (ref 3–12)
Neutro Abs: 3.6 10*3/uL (ref 1.7–7.7)
RDW: 12.9 % (ref 11.5–15.5)

## 2011-09-20 LAB — URINALYSIS W MICROSCOPIC + REFLEX CULTURE
Casts: NONE SEEN
Crystals: NONE SEEN
Leukocytes, UA: NEGATIVE
Nitrite: NEGATIVE
Squamous Epithelial / LPF: NONE SEEN

## 2011-09-20 LAB — CHOLESTEROL, TOTAL: Cholesterol: 185 mg/dL (ref 0–200)

## 2011-09-20 MED ORDER — NORETHINDRONE ACET-ETHINYL EST 1.5-30 MG-MCG PO TABS: ORAL_TABLET | ORAL | Status: DC

## 2011-09-20 NOTE — Telephone Encounter (Signed)
Pt said pharmacy never received birth control pills rx microgestin  for OV. rx called in.

## 2011-09-23 ENCOUNTER — Telehealth: Payer: Self-pay | Admitting: *Deleted

## 2011-09-23 ENCOUNTER — Other Ambulatory Visit: Payer: Self-pay | Admitting: *Deleted

## 2011-09-23 DIAGNOSIS — N920 Excessive and frequent menstruation with regular cycle: Secondary | ICD-10-CM

## 2011-09-23 DIAGNOSIS — N946 Dysmenorrhea, unspecified: Secondary | ICD-10-CM

## 2011-09-23 MED ORDER — NORETHINDRONE ACET-ETHINYL EST 1-20 MG-MCG PO TABS: 1.0000 | ORAL_TABLET | Freq: Every day | ORAL | Status: DC

## 2011-09-23 NOTE — Telephone Encounter (Signed)
Pt informed with the below note, pt said wrong rx was sent to pharmacy junel 1/20 is what pt takes, not junel 1.5/30

## 2011-09-23 NOTE — Telephone Encounter (Signed)
Pt is currently taking junel 1/20 and was suppose to start pill on Friday, but she didn't pick up pills until Sunday. Pt has missed three days worth of pills. Pt asked when should she start her pill today or when next cycle?  Please advise

## 2011-09-23 NOTE — Telephone Encounter (Signed)
Tell patient will be fine she starts the oral contraceptive pill today.

## 2012-06-19 ENCOUNTER — Other Ambulatory Visit: Payer: Self-pay

## 2012-06-19 MED ORDER — NORETHINDRONE ACET-ETHINYL EST 1-20 MG-MCG PO TABS: 1.0000 | ORAL_TABLET | Freq: Every day | ORAL | Status: DC

## 2012-09-24 ENCOUNTER — Ambulatory Visit (INDEPENDENT_AMBULATORY_CARE_PROVIDER_SITE_OTHER): Payer: PRIVATE HEALTH INSURANCE | Admitting: Gynecology

## 2012-09-24 ENCOUNTER — Encounter: Payer: Self-pay | Admitting: Gynecology

## 2012-09-24 VITALS — BP 112/70 | Ht 65.0 in | Wt 180.0 lb

## 2012-09-24 DIAGNOSIS — Z01419 Encounter for gynecological examination (general) (routine) without abnormal findings: Secondary | ICD-10-CM

## 2012-09-24 MED ORDER — NORETHINDRONE ACET-ETHINYL EST 1-20 MG-MCG PO TABS: ORAL_TABLET | ORAL | Status: DC

## 2012-09-24 NOTE — Patient Instructions (Addendum)

## 2012-09-25 LAB — CBC WITH DIFFERENTIAL/PLATELET
Basophils Absolute: 0 10*3/uL (ref 0.0–0.1)
Eosinophils Relative: 2 % (ref 0–5)
HCT: 42.3 % (ref 36.0–46.0)
Lymphocytes Relative: 33 % (ref 12–46)
MCH: 30.8 pg (ref 26.0–34.0)
MCHC: 34.5 g/dL (ref 30.0–36.0)
MCV: 89.2 fL (ref 78.0–100.0)
Monocytes Absolute: 0.6 10*3/uL (ref 0.1–1.0)
RDW: 13.4 % (ref 11.5–15.5)
WBC: 8.8 10*3/uL (ref 4.0–10.5)

## 2012-09-25 LAB — COMPREHENSIVE METABOLIC PANEL
AST: 19 U/L (ref 0–37)
BUN: 15 mg/dL (ref 6–23)
CO2: 31 mEq/L (ref 19–32)
Calcium: 9.4 mg/dL (ref 8.4–10.5)
Chloride: 101 mEq/L (ref 96–112)
Creat: 0.85 mg/dL (ref 0.50–1.10)

## 2012-09-25 LAB — URINALYSIS W MICROSCOPIC + REFLEX CULTURE
Bilirubin Urine: NEGATIVE
Glucose, UA: NEGATIVE mg/dL
Hgb urine dipstick: NEGATIVE
Protein, ur: NEGATIVE mg/dL
Urobilinogen, UA: 0.2 mg/dL (ref 0.0–1.0)

## 2012-09-25 NOTE — Progress Notes (Signed)
[name] 1975/03/12 [id number]   History:    37 y.o.  for annual gyn exam when no complaint. The patient has been followed by the Department of endocrinology at [facility] in [facility] for papillary thyroid carcinoma, follicular variant, bilateral, status post total thyroidectomy and May 2007, status post radioactive iodine for ablation of the thyroid, with subsequent hypothyroidism for which patient is on Synthroid 200 mcg daily.She suffers from dysmenorrhea and menorrhagia and has done well on the continuous oral contraceptive pill.. Last year she was prescribed Lysteda the patient was afraid to take medication. Patient has a history of endometriosis in the past. Her husband has had a vasectomy. The doctors at [facility] recently did her thyroid function tests were recently adjusted her Synthroid. She also has been followed by Dr. Newt Lukes her cardiologist for her PVCs. See medication list. She also recently had a colonoscopy and was diagnosed with IBS. And some ileitis. She was taking to NSAIDs for dysmenorrhea and menorrhagia. She is taking calcium and vitamin D for osteoporosis prevention.    Past medical history,surgical history, family history and social history were all reviewed and documented in the EPIC chart.  Gynecologic History No LMP recorded. Patient is not currently having periods (Reason: Other). Contraception: OCP (estrogen/progesterone) Last Pap: 2012. Results were: normal Last mammogram: age 43 baseline. Results were: normal  Obstetric History OB History  Gravida Para Term Preterm AB SAB TAB Ectopic Multiple Living  4 2   2 2    2     # Outcome Date GA Lbr Len/2nd Weight Sex Delivery Anes PTL Lv  4 SAB           3 SAB           2 PAR           1 PAR                ROS: A ROS was performed and pertinent positives and negatives are included in the history.  GENERAL: No fevers or chills. HEENT: No change in vision, no earache, sore  throat or sinus congestion. NECK: No pain or stiffness. CARDIOVASCULAR: No chest pain or pressure. No palpitations. PULMONARY: No shortness of breath, cough or wheeze. GASTROINTESTINAL: No abdominal pain, nausea, vomiting or diarrhea, melena or bright red blood per rectum. GENITOURINARY: No urinary frequency, urgency, hesitancy or dysuria. MUSCULOSKELETAL: No joint or muscle pain, no back pain, no recent trauma. DERMATOLOGIC: No rash, no itching, no lesions. ENDOCRINE: No polyuria, polydipsia, no heat or cold intolerance. No recent change in weight. HEMATOLOGICAL: No anemia or easy bruising or bleeding. NEUROLOGIC: No headache, seizures, numbness, tingling or weakness. PSYCHIATRIC: No depression, no loss of interest in normal activity or change in sleep pattern.     Exam: chaperone present  BP 112/70  Ht 5\' 5"  (1.651 m)  Wt 180 lb (81.647 kg)  BMI 29.95 kg/m2  Body mass index is 29.95 kg/(m^2).  General appearance : Well developed well nourished female. No acute distress HEENT: Neck supple, trachea midline, no carotid bruits, no thyroidmegaly Lungs: Clear to auscultation, no rhonchi or wheezes, or rib retractions  Heart: Regular rate and rhythm, no murmurs or gallops Breast:Examined in sitting and supine position were symmetrical in appearance, no palpable masses or tenderness,  no skin retraction, no nipple inversion, no nipple discharge, no skin discoloration, no axillary or supraclavicular lymphadenopathy Abdomen: no palpable masses or tenderness, no rebound or guarding Extremities: no edema or skin discoloration or tenderness  Pelvic:  Bartholin, Urethra, Skene Glands: Within normal limits             Vagina: No gross lesions or discharge  Cervix: No gross lesions or discharge  Uterus  anteverted, normal size, shape and consistency, non-tender and mobile  Adnexa  Without masses or tenderness  Anus and perineum  normal   Rectovaginal  normal sphincter tone without palpated masses or  tenderness             Hemoccult none indicated     Assessment/Plan:  37 y.o. female for annual exam who is doing well. The following labs ordered today: CBC, comprehensive metabolic panel and urinalysis. Her endocrinologist has done her thyroid function tests. Pap smear not done today the new guidelines were discussed. Cardiologist head done her cholesterol recently. She was reminded her monthly breast exam. We discussed importance of calcium and vitamin D for osteoporosis prevention. Patient's vaccine are up-to-date.    Ok Edwards MD, 8:33 AM 09/25/2012

## 2012-12-03 ENCOUNTER — Other Ambulatory Visit: Payer: Self-pay

## 2013-09-07 ENCOUNTER — Other Ambulatory Visit (HOSPITAL_COMMUNITY)
Admission: RE | Admit: 2013-09-07 | Discharge: 2013-09-07 | Disposition: A | Discharge status: Home / Self Care | Payer: PRIVATE HEALTH INSURANCE | Source: Ambulatory Visit | Attending: Gynecology | Admitting: Gynecology

## 2013-09-07 ENCOUNTER — Ambulatory Visit (INDEPENDENT_AMBULATORY_CARE_PROVIDER_SITE_OTHER): Payer: PRIVATE HEALTH INSURANCE | Admitting: Gynecology

## 2013-09-07 ENCOUNTER — Encounter: Payer: Self-pay | Admitting: Gynecology

## 2013-09-07 VITALS — BP 132/80 | Ht 65.0 in | Wt 186.8 lb

## 2013-09-07 DIAGNOSIS — Z01419 Encounter for gynecological examination (general) (routine) without abnormal findings: Secondary | ICD-10-CM

## 2013-09-07 DIAGNOSIS — Z1151 Encounter for screening for human papillomavirus (HPV): Secondary | ICD-10-CM | POA: Diagnosis present

## 2013-09-07 MED ORDER — NORETHINDRONE ACET-ETHINYL EST 1-20 MG-MCG PO TABS: ORAL_TABLET | ORAL | Status: DC

## 2013-09-07 NOTE — Progress Notes (Signed)
[name] Jun 02, 1975 [id number]   History:    38 y.o.  for annual gyn exam with no complaints today. Patient is on continuous oral contraceptive pill consisting of Junel 1/20 and she withdraws every 3 months. This has helped with her dysmenorrhea and menorrhagia history.The patient has been followed by the Department of endocrinology at [facility] in [facility] for papillary thyroid carcinoma, follicular variant, bilateral, status post total thyroidectomy and May 2007, status post radioactive iodine for ablation of the thyroid, with subsequent hypothyroidism for which patient is on Synthroid 200 mcg daily.Patient has a history of endometriosis in the past. Her husband has had a vasectomy. The doctors at [facility] recently did her thyroid function tests were recently adjusted her Synthroid. She also has been followed by Dr. Duke Salvia her cardiologist for her PVCs. See medication list. She also had a colonoscopy a few years ago as a result of IBS but no polyps. She is taking her calcium and vitamin D for osteoporosis prevention.   Past medical history,surgical history, family history and social history were all reviewed and documented in the EPIC chart.  Gynecologic History Patient's last menstrual period was 04/07/2013. Contraception: OCP (estrogen/progesterone) Last Pap: 2012. Results were: normal Last mammogram: Baseline age 89. Results were: normal  Obstetric History OB History  Gravida Para Term Preterm AB SAB TAB Ectopic Multiple Living  4 2   2 2    2     # Outcome Date GA Lbr Len/2nd Weight Sex Delivery Anes PTL Lv  4 SAB           3 SAB           2 PAR           1 PAR                ROS: A ROS was performed and pertinent positives and negatives are included in the history.  GENERAL: No fevers or chills. HEENT: No change in vision, no earache, sore throat or sinus congestion. NECK: No pain or stiffness. CARDIOVASCULAR: No chest pain or  pressure. No palpitations. PULMONARY: No shortness of breath, cough or wheeze. GASTROINTESTINAL: No abdominal pain, nausea, vomiting or diarrhea, melena or bright red blood per rectum. GENITOURINARY: No urinary frequency, urgency, hesitancy or dysuria. MUSCULOSKELETAL: No joint or muscle pain, no back pain, no recent trauma. DERMATOLOGIC: No rash, no itching, no lesions. ENDOCRINE: No polyuria, polydipsia, no heat or cold intolerance. No recent change in weight. HEMATOLOGICAL: No anemia or easy bruising or bleeding. NEUROLOGIC: No headache, seizures, numbness, tingling or weakness. PSYCHIATRIC: No depression, no loss of interest in normal activity or change in sleep pattern.     Exam: chaperone present  BP 132/80  Ht 5' 5"  (1.651 m)  Wt 186 lb 12.8 oz (84.732 kg)  BMI 31.09 kg/m2  LMP 04/07/2013  Body mass index is 31.09 kg/(m^2).  General appearance : Well developed well nourished female. No acute distress HEENT: Neck supple, trachea midline, no carotid bruits, no thyroidmegaly Lungs: Clear to auscultation, no rhonchi or wheezes, or rib retractions  Heart: Regular rate and rhythm, occasional PVCs Breast:Examined in sitting and supine position were symmetrical in appearance, no palpable masses or tenderness,  no skin retraction, no nipple inversion, no nipple discharge, no skin discoloration, no axillary or supraclavicular lymphadenopathy Abdomen: no palpable masses or tenderness, no rebound or guarding Extremities: no edema or skin discoloration or tenderness  Pelvic:  Bartholin, Urethra, Skene Glands: Within normal limits  Vagina: No gross lesions or discharge  Cervix: No gross lesions or discharge  Uterus  anteverted, normal size, shape and consistency, non-tender and mobile  Adnexa  Without masses or tenderness  Anus and perineum  normal   Rectovaginal  normal sphincter tone without palpated masses or tenderness             Hemoccult not indicated     Assessment/Plan:   38 y.o. female for annual exam doing well. A requisition for her to have her fasting lipid profile along with comprehensive metabolic panel and CBC at [employer] [employer] where she works. Her endocrinologist has been doing her thyroid function tests. Prescription refill for contraceptive pill provided. Pap smear was done today. The patient will obtain a list of her immunizations so that we can obtain her records.  Note: This dictation was prepared with  Dragon/digital dictation along withSmart phrase technology. Any transcriptional errors that result from this process are unintentional.   Terrance Mass MD, 4:57 PM 09/07/2013

## 2013-09-07 NOTE — Patient Instructions (Signed)
Breast Self-Awareness Practicing breast self-awareness may pick up problems early, prevent significant medical complications, and possibly save your life. By practicing breast self-awareness, you can become familiar with how your breasts look and feel and if your breasts are changing. This allows you to notice changes early. It can also offer you some reassurance that your breast health is good. One way to learn what is normal for your breasts and whether your breasts are changing is to do a breast self-exam. If you find a lump or something that was not present in the past, it is best to contact your caregiver right away. Other findings that should be evaluated by your caregiver include nipple discharge, especially if it is bloody; skin changes or reddening; areas where the skin seems to be pulled in (retracted); or new lumps and bumps. Breast pain is seldom associated with cancer (malignancy), but should also be evaluated by a caregiver. HOW TO PERFORM A BREAST SELF-EXAM The best time to examine your breasts is 5-7 days after your menstrual period is over. During menstruation, the breasts are lumpier, and it may be more difficult to pick up changes. If you do not menstruate, have reached menopause, or had your uterus removed (hysterectomy), you should examine your breasts at regular intervals, such as monthly. If you are breastfeeding, examine your breasts after a feeding or after using a breast pump. Breast implants do not decrease the risk for lumps or tumors, so continue to perform breast self-exams as recommended. Talk to your caregiver about how to determine the difference between the implant and breast tissue. Also, talk about the amount of pressure you should use during the exam. Over time, you will become more familiar with the variations of your breasts and more comfortable with the exam. A breast self-exam requires you to remove all your clothes above the waist. 1. Look at your breasts and nipples.  Stand in front of a mirror in a room with good lighting. With your hands on your hips, push your hands firmly downward. Look for a difference in shape, contour, and size from one breast to the other (asymmetry). Asymmetry includes puckers, dips, or bumps. Also, look for skin changes, such as reddened or scaly areas on the breasts. Look for nipple changes, such as discharge, dimpling, repositioning, or redness. 2. Carefully feel your breasts. This is best done either in the shower or tub while using soapy water or when flat on your back. Place the arm (on the side of the breast you are examining) above your head. Use the pads (not the fingertips) of your three middle fingers on your opposite hand to feel your breasts. Start in the underarm area and use  inch (2 cm) overlapping circles to feel your breast. Use 3 different levels of pressure (light, medium, and firm pressure) at each circle before moving to the next circle. The light pressure is needed to feel the tissue closest to the skin. The medium pressure will help to feel breast tissue a little deeper, while the firm pressure is needed to feel the tissue close to the ribs. Continue the overlapping circles, moving downward over the breast until you feel your ribs below your breast. Then, move one finger-width towards the center of the body. Continue to use the  inch (2 cm) overlapping circles to feel your breast as you move slowly up toward the collar bone (clavicle) near the base of the neck. Continue the up and down exam using all 3 pressures until you reach the  middle of the chest. Do this with each breast, carefully feeling for lumps or changes. 3.  Keep a written record with breast changes or normal findings for each breast. By writing this information down, you do not need to depend only on memory for size, tenderness, or location. Write down where you are in your menstrual cycle, if you are still menstruating. Breast tissue can have some lumps or  thick tissue. However, see your caregiver if you find anything that concerns you.  SEEK MEDICAL CARE IF:  You see a change in shape, contour, or size of your breasts or nipples.   You see skin changes, such as reddened or scaly areas on the breasts or nipples.   You have an unusual discharge from your nipples.   You feel a new lump or unusually thick areas.  Document Released: 01/14/2005 Document Revised: 01/01/2012 Document Reviewed: 05/01/2011 [facility] Patient Information 2015 [place], [place]. This information is not intended to replace advice given to you by your health care provider. Make sure you discuss any questions you have with your health care provider.

## 2013-09-09 LAB — CYTOLOGY - PAP

## 2013-09-21 ENCOUNTER — Telehealth: Payer: Self-pay | Admitting: *Deleted

## 2013-09-21 ENCOUNTER — Encounter: Payer: Self-pay | Admitting: Gynecology

## 2013-09-21 NOTE — Telephone Encounter (Signed)
[name] called from [facility] lab requesting diagnois for labs for gyn annual visit on 09/07/13. I called and give her [phone]

## 2013-09-22 ENCOUNTER — Telehealth: Payer: Self-pay | Admitting: *Deleted

## 2013-09-22 NOTE — Telephone Encounter (Signed)
Pt informed with the below note, order will be signed and pt will have done at her job [employer]. Order mailed to pt home address

## 2013-09-22 NOTE — Telephone Encounter (Signed)
Message copied by Thamas Jaegers on Wed Sep 22, 2013  9:49 AM ------      Message from: Terrance Mass      Created: Tue Sep 21, 2013  3:53 PM       Anderson Malta, please contact patient and told her that I just received her blood work done at [facility] and the only labs are received for her CBC and comprehensive metabolic panel which were normal. Her fasting lipid profile all parameters were normal with the exception of triglyceride which were elevated at 243 normal being less than 150. She needs to be started on a low fat diet with regular exercise. Please send her a cholesterol lowering diet handout. We need to repeat her fasting lipid profile in 6 months. If triglycerides continued to be elevated she will need to be started on statin ------

## 2013-11-29 ENCOUNTER — Encounter: Payer: Self-pay | Admitting: Gynecology

## 2014-09-15 ENCOUNTER — Ambulatory Visit (INDEPENDENT_AMBULATORY_CARE_PROVIDER_SITE_OTHER): Payer: PRIVATE HEALTH INSURANCE | Admitting: Gynecology

## 2014-09-15 ENCOUNTER — Encounter: Payer: Self-pay | Admitting: Gynecology

## 2014-09-15 VITALS — BP 124/80 | Ht 65.0 in | Wt 179.0 lb

## 2014-09-15 DIAGNOSIS — Z01419 Encounter for gynecological examination (general) (routine) without abnormal findings: Secondary | ICD-10-CM

## 2014-09-15 LAB — CBC WITH DIFFERENTIAL/PLATELET
BASOS ABS: 0.1 10*3/uL (ref 0.0–0.1)
Basophils Relative: 1 % (ref 0–1)
EOS ABS: 0.3 10*3/uL (ref 0.0–0.7)
EOS PCT: 3 % (ref 0–5)
HCT: 43 % (ref 36.0–46.0)
Hemoglobin: 15.1 g/dL — ABNORMAL HIGH (ref 12.0–15.0)
LYMPHS PCT: 20 % (ref 12–46)
Lymphs Abs: 2 10*3/uL (ref 0.7–4.0)
MCH: 31.6 pg (ref 26.0–34.0)
MCHC: 35.1 g/dL (ref 30.0–36.0)
MCV: 90 fL (ref 78.0–100.0)
MPV: 10.1 fL (ref 8.6–12.4)
Monocytes Absolute: 0.8 10*3/uL (ref 0.1–1.0)
Monocytes Relative: 8 % (ref 3–12)
Neutro Abs: 6.7 10*3/uL (ref 1.7–7.7)
Neutrophils Relative %: 68 % (ref 43–77)
PLATELETS: 243 10*3/uL (ref 150–400)
RBC: 4.78 MIL/uL (ref 3.87–5.11)
RDW: 12.9 % (ref 11.5–15.5)
WBC: 9.9 10*3/uL (ref 4.0–10.5)

## 2014-09-15 LAB — COMPREHENSIVE METABOLIC PANEL
ALK PHOS: 51 U/L (ref 33–115)
ALT: 20 U/L (ref 6–29)
AST: 17 U/L (ref 10–30)
Albumin: 4.1 g/dL (ref 3.6–5.1)
BILIRUBIN TOTAL: 0.4 mg/dL (ref 0.2–1.2)
BUN: 17 mg/dL (ref 7–25)
CO2: 26 mmol/L (ref 20–31)
Calcium: 9.2 mg/dL (ref 8.6–10.2)
Chloride: 105 mmol/L (ref 98–110)
Creat: 0.82 mg/dL (ref 0.50–1.10)
GLUCOSE: 89 mg/dL (ref 65–99)
Potassium: 5.4 mmol/L — ABNORMAL HIGH (ref 3.5–5.3)
SODIUM: 142 mmol/L (ref 135–146)
Total Protein: 7.4 g/dL (ref 6.1–8.1)

## 2014-09-15 LAB — LIPID PANEL
Cholesterol: 195 mg/dL (ref 125–200)
HDL: 51 mg/dL (ref >=46)
LDL Cholesterol: 95 mg/dL (ref <130)
Total CHOL/HDL Ratio: 3.8 Ratio (ref <=5.0)
Triglycerides: 244 mg/dL — ABNORMAL HIGH (ref <150)
VLDL: 49 mg/dL — ABNORMAL HIGH (ref <30)

## 2014-09-15 MED ORDER — NORETHINDRONE ACET-ETHINYL EST 1-20 MG-MCG PO TABS: ORAL_TABLET | ORAL | Status: DC

## 2014-09-15 NOTE — Progress Notes (Signed)
[name] [birth year] [id number]   History:    39 y.o.  for annual gyn exam with no complaints today. Patient is on continuous oral contraception pill consisting of June 01/20 which she withdrawals every 3 months and has done well as to her dysmenorrhea and menorrhagia. 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 Her husband has had a vasectomy. The doctors at [facility] recently did her thyroid function tests were recently adjusted her Synthroid. She also has been followed by Dr. Duke Salvia her cardiologist for her PVCs. See medication list. She also had a colonoscopy a few years ago as a result of IBS but no polyps. She is taking her calcium and vitamin D for osteoporosis prevention.  Under care everywhere I was able to pull out the last office note with the endocrinologist from the October 2015 visit and the following impression and disposition was noted:  IMPRESSION: 1. Papillary thyroid carcinoma, follicular variant, bilateral, with largest lesion 1.6 cm in the left lobe, status post total thyroidectomy in May 2007, status post 106 mCi of I-131 for ablation of thyroid remnant in August 2008, thyrogen-stimulated thyroglobulin of 0.6 in November 2009, and non-stimulated thyroglobulin of 0.3 in October 2014. 2. Surgical hypothyroidism.  DISPOSITION: 1. Free T4, TSH, and thyroglobulin are measured. 2. She is to continue on Synthroid 200 mcg daily for now. If she should have an elevation of her free T4, will consider decreasing her Synthroid from 200 mcg to 175 mcg daily (we should still be sufficient to keep her TSH on the  low side). Now that she is taking her thyroid hormone seven days a week, rather than six, it is possible that a lower dose will be warranted. 3. A return appointment is made for one year.  Review of patient's record indicated that last year her triglycerides were elevated at 243 and she did not return to repeat her fasting lipid profile within 6 months after diet instructions were provided. She is fasting today for blood work. Patient with no past history of any abnormal Pap smears   Past medical history,surgical history, family history and social history were all reviewed and documented in the EPIC chart.  Gynecologic History Patient's last menstrual period was 07/09/2014. Contraception: OCP (estrogen/progesterone) Last Pap: 2015. Results were: normal Last mammogram: 2015. Results were: Normal but dense  Obstetric History OB History  Gravida Para Term Preterm AB SAB TAB Ectopic Multiple Living  4 2   2 2    2     # Outcome Date GA Lbr Len/2nd Weight Sex Delivery Anes PTL Lv  4 SAB           3 SAB           2 Para           1 Para                ROS: A ROS was performed and pertinent positives and negatives are included in the history.  GENERAL: No fevers or chills. HEENT: No change in vision, no earache, sore throat or sinus congestion. NECK: No pain or stiffness. CARDIOVASCULAR: No chest pain or pressure. No palpitations. PULMONARY: No shortness of  breath, cough or wheeze. GASTROINTESTINAL: No abdominal pain, nausea, vomiting or diarrhea, melena or bright red blood per rectum. GENITOURINARY: No urinary frequency, urgency, hesitancy or dysuria. MUSCULOSKELETAL: No joint or muscle pain, no back pain, no recent trauma. DERMATOLOGIC: No rash, no itching, no lesions. ENDOCRINE: No polyuria, polydipsia, no heat or cold intolerance. No recent change in weight. HEMATOLOGICAL: No anemia or easy bruising or bleeding. NEUROLOGIC: No headache, seizures, numbness, tingling or weakness. PSYCHIATRIC:  No depression, no loss of interest in normal activity or change in sleep pattern.     Exam: chaperone present  BP 124/80 mmHg  Ht 5' 5"  (1.651 m)  Wt 179 lb (81.194 kg)  BMI 29.79 kg/m2  LMP 07/09/2014  Body mass index is 29.79 kg/(m^2).  General appearance : Well developed well nourished female. No acute distress HEENT: Eyes: no retinal hemorrhage or exudates,  Neck supple, trachea midline, no carotid bruits, no thyroidmegaly Lungs: Clear to auscultation, no rhonchi or wheezes, or rib retractions  Heart: Regular rate and rhythm, no murmurs or gallops Breast:Examined in sitting and supine position were symmetrical in appearance, no palpable masses or tenderness,  no skin retraction, no nipple inversion, no nipple discharge, no skin discoloration, no axillary or supraclavicular lymphadenopathy Abdomen: no palpable masses or tenderness, no rebound or guarding Extremities: no edema or skin discoloration or tenderness  Pelvic:  Bartholin, Urethra, Skene Glands: Within normal limits             Vagina: No gross lesions or discharge  Cervix: No gross lesions or discharge  Uterus  anteverted, normal size, shape and consistency, non-tender and mobile  Adnexa  Without masses or tenderness  Anus and perineum  normal   Rectovaginal  normal sphincter tone without palpated masses or tenderness             Hemoccult not indicated     Assessment/Plan:  39 y.o. female for annual exam who will continue to follow-up with her medical endocrinologist at [facility]. We are going to order the following screening blood work: Fasting lipid profile, compresses a metabolic panel, TSH, CBC, and urinalysis. She is scheduled for mammogram next week. We discussed importance of monthly breast exams. We discussed importance of calcium vitamin D and regular exercise for osteoporosis prevention. Patient has lost 8 pounds since last year.   Terrance Mass MD, 9:22 AM 09/15/2014

## 2014-09-16 LAB — URINALYSIS W MICROSCOPIC + REFLEX CULTURE
Bacteria, UA: NONE SEEN [HPF]
Bilirubin Urine: NEGATIVE
Casts: NONE SEEN [LPF]
Crystals: NONE SEEN [HPF]
Glucose, UA: NEGATIVE
Hgb urine dipstick: NEGATIVE
Ketones, ur: NEGATIVE
Leukocytes, UA: NEGATIVE
Nitrite: NEGATIVE
Protein, ur: NEGATIVE
Specific Gravity, Urine: 1.03 (ref 1.001–1.035)
WBC, UA: NONE SEEN WBC/HPF (ref <=5)
Yeast: NONE SEEN [HPF]
pH: 5.5 (ref 5.0–8.0)

## 2014-09-17 LAB — URINE CULTURE
Colony Count: NO GROWTH
ORGANISM ID, BACTERIA: NO GROWTH

## 2014-09-19 ENCOUNTER — Other Ambulatory Visit: Payer: Self-pay | Admitting: Gynecology

## 2014-09-30 ENCOUNTER — Encounter: Payer: Self-pay | Admitting: Gynecology

## 2015-03-30 ENCOUNTER — Telehealth: Payer: Self-pay | Admitting: *Deleted

## 2015-03-30 NOTE — Telephone Encounter (Signed)
Fasting lipid panel per Dr.Fernandez pt states she will have fasting lipid panel done at her PCP office she will send a copy of her results

## 2015-09-18 ENCOUNTER — Encounter: Payer: PRIVATE HEALTH INSURANCE | Admitting: Gynecology

## 2015-09-22 ENCOUNTER — Ambulatory Visit (INDEPENDENT_AMBULATORY_CARE_PROVIDER_SITE_OTHER): Payer: PRIVATE HEALTH INSURANCE | Admitting: Gynecology

## 2015-09-22 ENCOUNTER — Encounter: Payer: Self-pay | Admitting: Gynecology

## 2015-09-22 VITALS — BP 130/86 | Ht 64.5 in | Wt 179.0 lb

## 2015-09-22 DIAGNOSIS — Z85850 Personal history of malignant neoplasm of thyroid: Secondary | ICD-10-CM

## 2015-09-22 DIAGNOSIS — Z01419 Encounter for gynecological examination (general) (routine) without abnormal findings: Secondary | ICD-10-CM

## 2015-09-22 LAB — COMPREHENSIVE METABOLIC PANEL
ALBUMIN: 3.8 g/dL (ref 3.6–5.1)
ALT: 25 U/L (ref 6–29)
AST: 20 U/L (ref 10–30)
Alkaline Phosphatase: 60 U/L (ref 33–115)
BUN: 16 mg/dL (ref 7–25)
CHLORIDE: 104 mmol/L (ref 98–110)
CO2: 28 mmol/L (ref 20–31)
CREATININE: 0.77 mg/dL (ref 0.50–1.10)
Calcium: 9.3 mg/dL (ref 8.6–10.2)
Glucose, Bld: 85 mg/dL (ref 65–99)
Potassium: 4.6 mmol/L (ref 3.5–5.3)
SODIUM: 140 mmol/L (ref 135–146)
Total Bilirubin: 0.4 mg/dL (ref 0.2–1.2)
Total Protein: 6.7 g/dL (ref 6.1–8.1)

## 2015-09-22 LAB — CBC WITH DIFFERENTIAL/PLATELET
BASOS ABS: 0 {cells}/uL (ref 0–200)
Basophils Relative: 0 %
EOS PCT: 2 %
Eosinophils Absolute: 170 cells/uL (ref 15–500)
HCT: 43 % (ref 35.0–45.0)
HEMOGLOBIN: 14.6 g/dL (ref 11.7–15.5)
LYMPHS PCT: 26 %
Lymphs Abs: 2210 cells/uL (ref 850–3900)
MCH: 30.7 pg (ref 27.0–33.0)
MCHC: 34 g/dL (ref 32.0–36.0)
MCV: 90.5 fL (ref 80.0–100.0)
MONOS PCT: 5 %
MPV: 10.9 fL (ref 7.5–12.5)
Monocytes Absolute: 425 cells/uL (ref 200–950)
NEUTROS PCT: 67 %
Neutro Abs: 5695 cells/uL (ref 1500–7800)
PLATELETS: 233 10*3/uL (ref 140–400)
RBC: 4.75 MIL/uL (ref 3.80–5.10)
RDW: 12.9 % (ref 11.0–15.0)
WBC: 8.5 10*3/uL (ref 3.8–10.8)

## 2015-09-22 LAB — LIPID PANEL
Cholesterol: 179 mg/dL (ref 125–200)
HDL: 59 mg/dL (ref >=46)
LDL CALC: 99 mg/dL (ref <130)
TRIGLYCERIDES: 107 mg/dL (ref <150)
Total CHOL/HDL Ratio: 3 Ratio (ref <=5.0)
VLDL: 21 mg/dL (ref <30)

## 2015-09-22 MED ORDER — NORETHINDRONE ACET-ETHINYL EST 1-20 MG-MCG PO TABS: ORAL_TABLET | ORAL | 4 refills | Status: AC

## 2015-09-22 NOTE — Progress Notes (Signed)
[name] [birth year] [id number]  Patient is a 40 year old that presented to the office today for her annual gynecological examination. Patient is asymptomatic. Patient has done well with her 20 g oral contraceptive pill which she takes regularly and withdrawals every 3 months. 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 Her husband has had a vasectomy. The doctors at [facility] recently did her thyroid function tests were recently adjusted her Synthroid. She also has been followed by Dr. Duke Salvia her cardiologist for her PVCs. See medication list. She also had a colonoscopy a few years ago as a result of IBS but no polyps. She is taking her calcium and vitamin D for osteoporosis prevention.  Under care everywhere I was able to pull out the last office note with the endocrinologist from the October 2015 visit and the following impression and disposition was noted:  IMPRESSION: 1. Papillary thyroid carcinoma, follicular variant, bilateral, with largest lesion 1.6 cm in the left lobe, status post total thyroidectomy in May 2007, status post 106 mCi of I-131 for ablation of thyroid remnant in August 2008, thyrogen-stimulated thyroglobulin of 0.6 in November 2009, and non-stimulated thyroglobulin of 0.3 in October 2014. 2. Surgical hypothyroidism.  DISPOSITION: 1. Free T4, TSH, and thyroglobulin are measured. 2. She is to continue on Synthroid 200 mcg daily for now. If she should have an elevation of her free T4, will consider decreasing her Synthroid from 200 mcg to  175 mcg daily (we should still be sufficient to keep her TSH on the low side). Now that she is taking her thyroid hormone seven days a week, rather than six, it is possible that a lower dose will be warranted. 3. A return appointment is made for one year.  Review of patient's record indicated that last year her triglycerides were elevated at 243 and she did not return to repeat her fasting lipid profile within 6 months after diet instructions were provided. She is fasting today for blood work. Patient with no past history of any abnormal Pap smears  Past medical history,surgical history, family history and social history were all reviewed and documented in the EPIC chart.  Gynecologic History No LMP recorded. Contraception: OCP (estrogen/progesterone) Last Pap: 2015. Results were: normal Last mammogram: 2016. Results were: normal  Obstetric History OB History  Gravida Para Term Preterm AB Living  4 2     2 2   SAB TAB Ectopic Multiple Live Births  2            # Outcome Date GA Lbr Len/2nd Weight Sex Delivery Anes PTL Lv  4 SAB           3 SAB           2 Para           1 Para                ROS: A ROS was performed and pertinent positives and negatives are included in the history.  GENERAL: No fevers or chills. HEENT: No change in vision, no earache, sore throat or sinus congestion. NECK: No pain or stiffness. CARDIOVASCULAR: No chest pain or pressure.  No palpitations. PULMONARY: No shortness of breath, cough or wheeze. GASTROINTESTINAL: No abdominal pain, nausea, vomiting or diarrhea, melena or bright red blood per rectum. GENITOURINARY: No urinary frequency, urgency, hesitancy or dysuria. MUSCULOSKELETAL: No joint or muscle pain, no back pain, no recent trauma. DERMATOLOGIC: No rash, no itching, no lesions. ENDOCRINE: No polyuria, polydipsia, no heat or cold intolerance. No recent change in weight. HEMATOLOGICAL: No anemia or easy bruising or bleeding. NEUROLOGIC: No headache,  seizures, numbness, tingling or weakness. PSYCHIATRIC: No depression, no loss of interest in normal activity or change in sleep pattern.     Exam: chaperone present  BP 130/86   Ht 5' 4.5" (1.638 m)   Wt 179 lb (81.2 kg)   BMI 30.25 kg/m   Body mass index is 30.25 kg/m.  General appearance : Well developed well nourished female. No acute distress HEENT: Eyes: no retinal hemorrhage or exudates,  Neck supple, trachea midline, no carotid bruits, no thyroidmegaly Lungs: Clear to auscultation, no rhonchi or wheezes, or rib retractions  Heart: Regular rate and rhythm, no murmurs or gallops Breast:Examined in sitting and supine position were symmetrical in appearance, no palpable masses or tenderness,  no skin retraction, no nipple inversion, no nipple discharge, no skin discoloration, no axillary or supraclavicular lymphadenopathy Abdomen: no palpable masses or tenderness, no rebound or guarding Extremities: no edema or skin discoloration or tenderness  Pelvic:  Bartholin, Urethra, Skene Glands: Within normal limits             Vagina: No gross lesions or discharge  Cervix: No gross lesions or discharge  Uterus  anteverted, normal size, shape and consistency, non-tender and mobile  Adnexa  Without masses or tenderness  Anus and perineum  normal   Rectovaginal  normal sphincter tone without palpated masses or tenderness             Hemoccult not indicated     Assessment/Plan:  40 y.o. female for annual exam with hypertriglyceridemia last year did not return for follow-up. We are going to check her fasting lipid profile today. I've explained to her that if her triglycerides are still elevated she will need to be started on a statin. Cholesterol lowering diet handout was provided. The following screening blood work was done today: Fasting lipid profile, comprehensive metabolic panel, TSH, CBC, and urinalysis. Her endocrinologist is been monitor her thyroid function test. Pap smear not  indicated this year.   Terrance Mass MD, 10:16 AM 09/22/2015

## 2015-09-22 NOTE — Patient Instructions (Signed)
Patient information: High cholesterol (The Basics)  What is cholesterol? - Cholesterol is a substance that is found in the blood. Everyone has some. It is needed for good health. The problem is, people sometimes have too much cholesterol. Compared with people with normal cholesterol, people with high cholesterol have a higher risk of heart attacks, strokes, and other health problems. The higher your cholesterol, the higher your risk of these problems. Cholesterol levels in your body are determined significantly by your diet. Cholesterol levels may also be related to heart disease. The following material helps to explain this relationship and discusses what you can do to help keep your heart healthy. Not all cholesterol is bad. Low-density lipoprotein (LDL) cholesterol is the "bad" cholesterol. It may cause fatty deposits to build up inside your arteries. High-density lipoprotein (HDL) cholesterol is "good." It helps to remove the "bad" LDL cholesterol from your blood. Cholesterol is a very important risk factor for heart disease. Other risk factors are high blood pressure, smoking, stress, heredity, and weight.  The heart muscle gets its supply of blood through the coronary arteries. If your LDL cholesterol is high and your HDL cholesterol is low, you are at risk for having fatty deposits build up in your coronary arteries. This leaves less room through which blood can flow. Without sufficient blood and oxygen, the heart muscle cannot function properly and you may feel chest pains (angina pectoris). When a coronary artery closes up entirely, a part of the heart muscle may die, causing a heart attack (myocardial infarction).  CHECKING CHOLESTEROL When your caregiver sends your blood to a lab to be analyzed for cholesterol, a complete lipid (fat) profile may be done. With this test, the total amount of cholesterol and levels of LDL and HDL are determined. Triglycerides  are a type of fat that circulates in the blood and can also be used to determine heart disease risk. Are there different types of cholesterol? - Yes, there are a few different types. If you get a cholesterol test, you may hear your doctor or nurse talk about: Total cholesterol  LDL cholesterol - Some people call this the "bad" cholesterol. That's because having high LDL levels raises your risk of heart attacks, strokes, and other health problems.  HDL cholesterol - Some people call this the "good" cholesterol. That's because having high HDL levels lowers your risk of heart attacks, strokes, and other health problems.  Non-HDL cholesterol - Non-HDL cholesterol is your total cholesterol minus your HDL cholesterol.  Triglycerides - Triglycerides are not cholesterol. They are a type of fat. But they often get measured when cholesterol is measured. (Having high triglycerides also seems to increase the risk of heart attacks and strokes.)   Keep in mind, though, that many people who cannot meet these goals still have a low risk of heart attacks and strokes. What should I do if my doctor tells me I have high cholesterol? - Ask your doctor what your overall risk of heart attacks and strokes is. High cholesterol, by itself, is not always a reason to worry. Having high cholesterol is just one of many things that can increase your risk of heart attacks and strokes. Other factors that increase your risk include:  Cigarette smoking  High blood pressure  Having a parent, sister, or brother who got heart disease at a young age Annamaria Boots, in this case, means younger than 99 for men and younger than 85 for women.)  Being a man (Women are at risk, too, but men  have a higher risk.)  Older age  If you are at high risk of heart attacks and strokes, having high cholesterol is a problem. On the other hand, if you have are at low risk, having high cholesterol may not mean much. Should I take medicine to lower cholesterol? - Not  everyone who has high cholesterol needs medicines. Your doctor or nurse will decide if you need them based on your age, family history, and other health concerns.  You should probably take a cholesterol-lowering medicine called a statin if you: Already had a heart attack or stroke  Have known heart disease  Have diabetes  Have a condition called peripheral artery disease, which makes it painful to walk, and happens when the arteries in your legs get clogged with fatty deposits  Have an abdominal aortic aneurysm, which is a widening of the main artery in the belly  Most people with any of the conditions listed above should take a statin no matter what their cholesterol level is. If your doctor or nurse puts you on a statin, stay on it. The medicine may not make you feel any different. But it can help prevent heart attacks, strokes, and death.  Can I lower my cholesterol without medicines? - Yes, you can lower your cholesterol some by:  Avoiding red meat, butter, fried foods, cheese, and other foods that have a lot of saturated fat  Losing weight (if you are overweight)  Being more active Even if these steps do little to change your cholesterol, they can improve your health in many ways.                                                   Cholesterol Control Diet  CONTROLLING CHOLESTEROL WITH DIET Although exercise and lifestyle factors are important, your diet is key. That is because certain foods are known to raise cholesterol and others to lower it. The goal is to balance foods for their effect on cholesterol and more importantly, to replace saturated and trans fat with other types of fat, such as monounsaturated fat, polyunsaturated fat, and omega-3 fatty acids. On average, a person should consume no more than 15 to 17 g of saturated fat daily. Saturated and trans fats are considered "bad" fats, and they will raise LDL cholesterol. Saturated fats are primarily found in animal products such as  meats, butter, and cream. However, that does not mean you need to sacrifice all your favorite foods. Today, there are good tasting, low-fat, low-cholesterol substitutes for most of the things you like to eat. Choose low-fat or nonfat alternatives. Choose round or loin cuts of red meat, since these types of cuts are lowest in fat and cholesterol. Chicken (without the skin), fish, veal, and ground [place] breast are excellent choices. Eliminate fatty meats, such as hot dogs and salami. Even shellfish have little or no saturated fat. Have a 3 oz (85 g) portion when you eat lean meat, poultry, or fish. Trans fats are also called "partially hydrogenated oils." They are oils that have been scientifically manipulated so that they are solid at room temperature resulting in a longer shelf life and improved taste and texture of foods in which they are added. Trans fats are found in stick margarine, some tub margarines, cookies, crackers, and baked goods.  When baking and cooking, oils are an excellent substitute  for butter. The monounsaturated oils are especially beneficial since it is believed they lower LDL and raise HDL. The oils you should avoid entirely are saturated tropical oils, such as coconut and palm.  Remember to eat liberally from food groups that are naturally free of saturated and trans fat, including fish, fruit, vegetables, beans, grains (barley, rice, couscous, bulgur wheat), and pasta (without cream sauces).  IDENTIFYING FOODS THAT LOWER CHOLESTEROL  Soluble fiber may lower your cholesterol. This type of fiber is found in fruits such as apples, vegetables such as broccoli, potatoes, and carrots, legumes such as beans, peas, and lentils, and grains such as barley. Foods fortified with plant sterols (phytosterol) may also lower cholesterol. You should eat at least 2 g per day of these foods for a cholesterol lowering effect.  Read package labels to identify low-saturated fats, trans fats free, and  low-fat foods at the supermarket. Select cheeses that have only 2 to 3 g saturated fat per ounce. Use a heart-healthy tub margarine that is free of trans fats or partially hydrogenated oil. When buying baked goods (cookies, crackers), avoid partially hydrogenated oils. Breads and muffins should be made from whole grains (whole-wheat or whole oat flour, instead of "flour" or "enriched flour"). Buy non-creamy canned soups with reduced salt and no added fats.  FOOD PREPARATION TECHNIQUES  Never deep-fry. If you must fry, either stir-fry, which uses very little fat, or use non-stick cooking sprays. When possible, broil, bake, or roast meats, and steam vegetables. Instead of dressing vegetables with butter or margarine, use lemon and herbs, applesauce and cinnamon (for squash and sweet potatoes), nonfat yogurt, salsa, and low-fat dressings for salads.  LOW-SATURATED FAT / LOW-FAT FOOD SUBSTITUTES Meats / Saturated Fat (g)  Avoid: Steak, marbled (3 oz/85 g) / 11 g   Choose: Steak, lean (3 oz/85 g) / 4 g   Avoid: Hamburger (3 oz/85 g) / 7 g   Choose: Hamburger, lean (3 oz/85 g) / 5 g   Avoid: Ham (3 oz/85 g) / 6 g   Choose: Ham, lean cut (3 oz/85 g) / 2.4 g   Avoid: Chicken, with skin, dark meat (3 oz/85 g) / 4 g   Choose: Chicken, skin removed, dark meat (3 oz/85 g) / 2 g   Avoid: Chicken, with skin, light meat (3 oz/85 g) / 2.5 g   Choose: Chicken, skin removed, light meat (3 oz/85 g) / 1 g  Dairy / Saturated Fat (g)  Avoid: Whole milk (1 cup) / 5 g   Choose: Low-fat milk, 2% (1 cup) / 3 g   Choose: Low-fat milk, 1% (1 cup) / 1.5 g   Choose: Skim milk (1 cup) / 0.3 g   Avoid: Hard cheese (1 oz/28 g) / 6 g   Choose: Skim milk cheese (1 oz/28 g) / 2 to 3 g   Avoid: Cottage cheese, 4% fat (1 cup) / 6.5 g   Choose: Low-fat cottage cheese, 1% fat (1 cup) / 1.5 g   Avoid: Ice cream (1 cup) / 9 g   Choose: Sherbet (1 cup) / 2.5 g   Choose: Nonfat frozen yogurt (1 cup) / 0.3 g    Choose: Frozen fruit bar / trace   Avoid: Whipped cream (1 tbs) / 3.5 g   Choose: Nondairy whipped topping (1 tbs) / 1 g  Condiments / Saturated Fat (g)  Avoid: Mayonnaise (1 tbs) / 2 g   Choose: Low-fat mayonnaise (1 tbs) / 1 g   Avoid:  Butter (1 tbs) / 7 g   Choose: Extra light margarine (1 tbs) / 1 g   Avoid: Coconut oil (1 tbs) / 11.8 g   Choose: Olive oil (1 tbs) / 1.8 g   Choose: Corn oil (1 tbs) / 1.7 g   Choose: Safflower oil (1 tbs) / 1.2 g   Choose: Sunflower oil (1 tbs) / 1.4 g   Choose: Soybean oil (1 tbs) / 2.4 g   Choose: Canola oil (1 tbs) / 1 g

## 2015-09-23 LAB — URINALYSIS W MICROSCOPIC + REFLEX CULTURE
BACTERIA UA: NONE SEEN [HPF]
BILIRUBIN URINE: NEGATIVE
CRYSTALS: NONE SEEN [HPF]
Casts: NONE SEEN [LPF]
GLUCOSE, UA: NEGATIVE
Hgb urine dipstick: NEGATIVE
KETONES UR: NEGATIVE
LEUKOCYTES UA: NEGATIVE
Nitrite: NEGATIVE
Protein, ur: NEGATIVE
SPECIFIC GRAVITY, URINE: 1.023 (ref 1.001–1.035)
WBC UA: NONE SEEN WBC/HPF (ref <=5)
Yeast: NONE SEEN [HPF]
pH: 7.5 (ref 5.0–8.0)

## 2015-09-24 LAB — URINE CULTURE

## 2015-10-11 ENCOUNTER — Encounter: Payer: Self-pay | Admitting: Gynecology

## 2016-06-12 ENCOUNTER — Encounter: Payer: Self-pay | Admitting: Gynecology
